# Patient Record
Sex: Female | Born: 1945 | Race: White | Hispanic: No | Marital: Married | Smoking: Never smoker
Health system: Southern US, Community
[De-identification: ages and names within clinical notes are randomized; demographics above are authoritative.]

## PROBLEM LIST (undated history)

## (undated) DIAGNOSIS — T8859XA Other complications of anesthesia, initial encounter: Secondary | ICD-10-CM

## (undated) DIAGNOSIS — M1990 Unspecified osteoarthritis, unspecified site: Secondary | ICD-10-CM

## (undated) DIAGNOSIS — T4145XA Adverse effect of unspecified anesthetic, initial encounter: Secondary | ICD-10-CM

## (undated) DIAGNOSIS — G43909 Migraine, unspecified, not intractable, without status migrainosus: Secondary | ICD-10-CM

## (undated) DIAGNOSIS — F32A Depression, unspecified: Secondary | ICD-10-CM

## (undated) DIAGNOSIS — F329 Major depressive disorder, single episode, unspecified: Secondary | ICD-10-CM

## (undated) DIAGNOSIS — I10 Essential (primary) hypertension: Secondary | ICD-10-CM

## (undated) HISTORY — DX: Depression, unspecified: F32.A

## (undated) HISTORY — DX: Unspecified osteoarthritis, unspecified site: M19.90

## (undated) HISTORY — PX: CARPAL TUNNEL RELEASE: SHX101

## (undated) HISTORY — DX: Major depressive disorder, single episode, unspecified: F32.9

## (undated) HISTORY — PX: OTHER SURGICAL HISTORY: SHX169

## (undated) HISTORY — DX: Migraine, unspecified, not intractable, without status migrainosus: G43.909

## (undated) HISTORY — DX: Essential (primary) hypertension: I10

---

## 1973-03-24 HISTORY — PX: TUBAL LIGATION: SHX77

## 1998-08-30 ENCOUNTER — Other Ambulatory Visit: Admission: RE | Admit: 1998-08-30 | Discharge: 1998-08-30 | Payer: Self-pay | Admitting: Obstetrics and Gynecology

## 2000-06-23 ENCOUNTER — Other Ambulatory Visit: Admission: RE | Admit: 2000-06-23 | Discharge: 2000-06-23 | Payer: Self-pay | Admitting: Orthopedic Surgery

## 2000-11-29 ENCOUNTER — Emergency Department (HOSPITAL_COMMUNITY): Admission: EM | Admit: 2000-11-29 | Discharge: 2000-11-29 | Payer: Self-pay | Admitting: Emergency Medicine

## 2007-01-06 ENCOUNTER — Ambulatory Visit (HOSPITAL_COMMUNITY): Admission: RE | Admit: 2007-01-06 | Discharge: 2007-01-06 | Payer: Self-pay | Admitting: *Deleted

## 2007-01-25 ENCOUNTER — Encounter: Admission: RE | Admit: 2007-01-25 | Discharge: 2007-01-25 | Payer: Self-pay | Admitting: *Deleted

## 2007-01-28 ENCOUNTER — Ambulatory Visit (HOSPITAL_COMMUNITY): Admission: RE | Admit: 2007-01-28 | Discharge: 2007-01-28 | Payer: Self-pay | Admitting: *Deleted

## 2007-08-24 ENCOUNTER — Encounter: Admission: RE | Admit: 2007-08-24 | Discharge: 2007-11-22 | Payer: Self-pay | Admitting: *Deleted

## 2007-09-06 ENCOUNTER — Ambulatory Visit (HOSPITAL_COMMUNITY): Admission: RE | Admit: 2007-09-06 | Discharge: 2007-09-07 | Payer: Self-pay | Admitting: Pediatrics

## 2007-12-15 ENCOUNTER — Encounter: Admission: RE | Admit: 2007-12-15 | Discharge: 2007-12-15 | Payer: Self-pay | Admitting: *Deleted

## 2010-06-20 LAB — HM MAMMOGRAPHY: HM Mammogram: NORMAL

## 2010-07-03 ENCOUNTER — Encounter: Payer: BLUE CROSS/BLUE SHIELD | Attending: Surgery | Admitting: *Deleted

## 2010-07-03 DIAGNOSIS — Z09 Encounter for follow-up examination after completed treatment for conditions other than malignant neoplasm: Secondary | ICD-10-CM | POA: Insufficient documentation

## 2010-07-03 DIAGNOSIS — Z713 Dietary counseling and surveillance: Secondary | ICD-10-CM | POA: Insufficient documentation

## 2010-07-03 DIAGNOSIS — Z9884 Bariatric surgery status: Secondary | ICD-10-CM | POA: Insufficient documentation

## 2010-08-06 NOTE — Op Note (Signed)
NAMEJORDYNE, Shawna Ellison                 ACCOUNT NO.:  [id number]   MEDICAL RECORD NO.:  [id number]          PATIENT TYPE:  OIB   LOCATION:  1619                         FACILITY:  [facility]   PHYSICIAN:  Alfonse Ras, MD   DATE OF BIRTH:  [birth year]   DATE OF PROCEDURE:  09/06/2007  DATE OF DISCHARGE:                               OPERATIVE REPORT   PREOPERATIVE DIAGNOSIS:  Medically refractory morbid obesity and  possible hiatal hernia.   POSTOPERATIVE DIAGNOSIS:  Medically refractory morbid obesity.  No clear  evidence of hiatal hernia.   PROCEDURE:  Laparoscopic adjustable gastric banding with APS system.   SURGEON:  Baruch Merl, M.D.   ASSISTANT:  Sandria Bales. Ezzard Standing, M.D.   ANESTHESIA:  General.   DESCRIPTION:  The patient was taken to the operating room, placed in  supine position.  After adequate general anesthesia was induced using  endotracheal tube, the abdomen was prepped and draped in normal sterile  fashion.  Using the left upper quadrant incision, peritoneal access was  obtained with the Optiview under direct vision through a 10-mm trocar.  Additional 15-mm trocar was placed under direct vision in the right  upper quadrant and 11-mm trocar was placed in the right mid abdomen.  Additional 11-mm trocar was placed in the left paramedian supraumbilical  region.  A Nathanson liver retractor was placed in the left lateral  segment and the liver was retracted anteriorly.  A 5-mm trocar was  placed in the right abdomen.  The EG junction was then visualized and  the sizing balloon was placed down into the stomach.  A 15 cc of air was  placed within the balloon that was pulled gently backwards until  resistance was felt.  There was no evidence of hiatal hernia.  The angle  of His was then gently dissected with sharp and blunt dissection.  Using  a pars gastric technique, a retrogastric tunnel was created starting at  the crossing fat at the right crus going posterior to the  stomach and  coming out at the angle of His.  APS band was then placed and snapped  and placed over the sizing balloon.  An anterior fundoplication was  accomplished with interrupted 2-0 Surgilon sutures x3.  The band  appeared in very good position and the tubing was brought out through  the right lower port.  Adequate hemostasis was ensured and Nathanson  liver retractor was removed and the port was affixed to the tubing and  fixed to the anterior abdominal fascia with interrupted 2-0 Prolene  sutures.  Skin incisions were injected with 0.5 Marcaine and closed with  staples.  Sterile dressings were applied.  The patient tolerated the  procedure well and went to PACU in good condition.      Alfonse Ras, MD  Electronically Signed     KRE/MEDQ  D:  09/06/2007  T:  09/06/2007  Job:  [id number]

## 2010-10-03 ENCOUNTER — Encounter: Payer: Self-pay | Admitting: Family Medicine

## 2010-12-19 LAB — DIFFERENTIAL
Basophils Relative: 0
Eosinophils Absolute: 0.1
Eosinophils Absolute: 0.1
Eosinophils Relative: 1
Lymphocytes Relative: 38
Lymphs Abs: 2
Monocytes Absolute: 0.5
Neutro Abs: 2.7
Neutrophils Relative %: 51

## 2010-12-19 LAB — BASIC METABOLIC PANEL
CO2: 29
Calcium: 9.4
Creatinine, Ser: 0.8
GFR calc non Af Amer: >60
Potassium: 3.6

## 2010-12-19 LAB — CBC
HCT: 40.9
HCT: 34.4 — ABNORMAL LOW
MCV: 90.9
MCV: 92.4
Platelets: 295
Platelets: 216
RBC: 4.5
RBC: 3.72 — ABNORMAL LOW
RDW: 13.1

## 2011-08-23 LAB — HM MAMMOGRAPHY: HM Mammogram: NORMAL

## 2012-03-12 ENCOUNTER — Ambulatory Visit (INDEPENDENT_AMBULATORY_CARE_PROVIDER_SITE_OTHER): Payer: BC Managed Care – PPO | Admitting: Internal Medicine

## 2012-03-12 ENCOUNTER — Other Ambulatory Visit (INDEPENDENT_AMBULATORY_CARE_PROVIDER_SITE_OTHER): Payer: BC Managed Care – PPO

## 2012-03-12 ENCOUNTER — Encounter: Payer: Self-pay | Admitting: Internal Medicine

## 2012-03-12 VITALS — BP 120/72 | HR 88 | Temp 97.6°F | Resp 16 | Ht 63.0 in | Wt 202.0 lb

## 2012-03-12 DIAGNOSIS — E669 Obesity, unspecified: Secondary | ICD-10-CM | POA: Insufficient documentation

## 2012-03-12 DIAGNOSIS — Z23 Encounter for immunization: Secondary | ICD-10-CM

## 2012-03-12 DIAGNOSIS — Z9884 Bariatric surgery status: Secondary | ICD-10-CM

## 2012-03-12 DIAGNOSIS — I10 Essential (primary) hypertension: Secondary | ICD-10-CM | POA: Insufficient documentation

## 2012-03-12 LAB — CBC WITH DIFFERENTIAL/PLATELET
Basophils Absolute: 0.1 10*3/uL (ref 0.0–0.1)
Eosinophils Absolute: 0.2 10*3/uL (ref 0.0–0.7)
Eosinophils Relative: 2.4 % (ref 0.0–5.0)
Lymphocytes Relative: 36.7 % (ref 12.0–46.0)
MCHC: 34.1 g/dL (ref 30.0–36.0)
Monocytes Absolute: 0.6 10*3/uL (ref 0.1–1.0)
Neutrophils Relative %: 51.3 % (ref 43.0–77.0)
Platelets: 317 10*3/uL (ref 150.0–400.0)
RBC: 4.67 Mil/uL (ref 3.87–5.11)
RDW: 13.5 % (ref 11.5–14.6)
WBC: 6.8 10*3/uL (ref 4.5–10.5)

## 2012-03-12 LAB — URINALYSIS, ROUTINE W REFLEX MICROSCOPIC
Hgb urine dipstick: NEGATIVE
Ketones, ur: NEGATIVE
Leukocytes, UA: NEGATIVE
Nitrite: NEGATIVE
Specific Gravity, Urine: 1.01 (ref 1.000–1.030)
Urobilinogen, UA: 0.2 (ref 0.0–1.0)

## 2012-03-12 LAB — VITAMIN B12: Vitamin B-12: 305 pg/mL (ref 211–911)

## 2012-03-12 LAB — COMPREHENSIVE METABOLIC PANEL
Alkaline Phosphatase: 80 U/L (ref 39–117)
Creatinine, Ser: 0.8 mg/dL (ref 0.4–1.2)
Glucose, Bld: 89 mg/dL (ref 70–99)
Sodium: 139 mEq/L (ref 135–145)
Total Bilirubin: 0.5 mg/dL (ref 0.3–1.2)
Total Protein: 7.5 g/dL (ref 6.0–8.3)

## 2012-03-12 LAB — LIPID PANEL
Cholesterol: 174 mg/dL (ref 0–200)
Triglycerides: 54 mg/dL (ref 0.0–149.0)

## 2012-03-12 LAB — HEMOGLOBIN A1C: Hgb A1c MFr Bld: 6 % (ref 4.6–6.5)

## 2012-03-12 LAB — IBC PANEL: Saturation Ratios: 17.8 % — ABNORMAL LOW (ref 20.0–50.0)

## 2012-03-12 LAB — FOLATE: Folate: 9.2 ng/mL (ref >5.9)

## 2012-03-12 MED ORDER — ZOSTER VACCINE LIVE 19400 UNT/0.65ML ~~LOC~~ SOLR: 0.6500 mL | Freq: Once | SUBCUTANEOUS | Status: DC

## 2012-03-12 NOTE — Assessment & Plan Note (Signed)
Labs today to screen her for secondary causes and complications as well

## 2012-03-12 NOTE — Assessment & Plan Note (Signed)
Her BP is well controlled I will check her lytes and renal function today 

## 2012-03-12 NOTE — Patient Instructions (Signed)

## 2012-03-12 NOTE — Progress Notes (Signed)
  Subjective:    Patient ID: Shawna Ellison, female    DOB: 12/16/1945, 66 y.o.   MRN: [id number]  Hypertension This is a chronic problem. The current episode started more than 1 year ago. The problem is unchanged. The problem is controlled. Pertinent negatives include no anxiety, blurred vision, chest pain, headaches, malaise/fatigue, neck pain, orthopnea, palpitations, peripheral edema, PND, shortness of breath or sweats. Past treatments include diuretics. The current treatment provides moderate improvement. There are no compliance problems.       Review of Systems  Constitutional: Negative for fever, chills, malaise/fatigue, diaphoresis, activity change, appetite change, fatigue and unexpected weight change.  HENT: Negative.  Negative for neck pain.   Eyes: Negative.  Negative for blurred vision.  Respiratory: Negative for apnea, cough, choking, chest tightness, shortness of breath, wheezing and stridor.   Cardiovascular: Negative for chest pain, palpitations, orthopnea, leg swelling and PND.  Gastrointestinal: Negative for nausea, vomiting, abdominal pain, diarrhea and constipation.  Genitourinary: Negative.   Musculoskeletal: Positive for arthralgias (knees). Negative for myalgias, back pain, joint swelling and gait problem.  Neurological: Negative.  Negative for headaches.  Hematological: Negative for adenopathy. Does not bruise/bleed easily.  Psychiatric/Behavioral: Negative.        Objective:   Physical Exam  Vitals reviewed. Constitutional: She is oriented to person, place, and time. She appears well-developed and well-nourished. No distress.  HENT:  Head: Normocephalic and atraumatic.  Mouth/Throat: Oropharynx is clear and moist. No oropharyngeal exudate.  Eyes: Conjunctivae normal are normal. Right eye exhibits no discharge. Left eye exhibits no discharge. No scleral icterus.  Neck: Normal range of motion. Neck supple. No JVD present. No tracheal deviation present. No  thyromegaly present.  Cardiovascular: Normal rate, regular rhythm, normal heart sounds and intact distal pulses.  Exam reveals no gallop and no friction rub.   No murmur heard. Pulmonary/Chest: Effort normal and breath sounds normal. No stridor. No respiratory distress. She has no wheezes. She has no rales. She exhibits no tenderness.  Abdominal: Soft. Bowel sounds are normal. She exhibits no distension and no mass. There is no tenderness. There is no rebound and no guarding.  Musculoskeletal: Normal range of motion. She exhibits no edema and no tenderness.  Lymphadenopathy:    She has no cervical adenopathy.  Neurological: She is oriented to person, place, and time.  Skin: Skin is warm and dry. No rash noted. She is not diaphoretic. No erythema. No pallor.  Psychiatric: She has a normal mood and affect. Her behavior is normal. Judgment and thought content normal.      Lab Results  Component Value Date   WBC 5.3 09/07/2007   HGB 11.7* 09/07/2007   HCT 34.4* 09/07/2007   PLT 216 09/07/2007   GLUCOSE 118* 09/01/2007   NA 139 09/01/2007   K 3.6 09/01/2007   CL 102 09/01/2007   CREATININE 0.80 09/01/2007   BUN 13 09/01/2007   CO2 29 09/01/2007      Assessment & Plan:

## 2012-03-12 NOTE — Assessment & Plan Note (Signed)
I will check her labs today to screen her for vit deficiencies and other complications

## 2012-03-17 LAB — VITAMIN D 1,25 DIHYDROXY
Vitamin D 1, 25 (OH)2 Total: 53 pg/mL (ref 18–72)
Vitamin D2 1, 25 (OH)2: <8 pg/mL
Vitamin D3 1, 25 (OH)2: 53 pg/mL

## 2012-03-21 ENCOUNTER — Encounter: Payer: Self-pay | Admitting: Internal Medicine

## 2012-03-29 ENCOUNTER — Telehealth (INDEPENDENT_AMBULATORY_CARE_PROVIDER_SITE_OTHER): Payer: Self-pay | Admitting: Surgery

## 2012-03-29 NOTE — Telephone Encounter (Signed)
03/29/12 mailed bariatric surgery recall letter to patient. Advised patient to call [phone] to schedule a bariatric follow-up appointment. (lss)

## 2012-05-17 LAB — HM COLONOSCOPY

## 2012-07-08 ENCOUNTER — Encounter: Payer: Self-pay | Admitting: Internal Medicine

## 2012-10-13 ENCOUNTER — Encounter: Payer: Self-pay | Admitting: Internal Medicine

## 2012-10-13 MED ORDER — FUROSEMIDE 20 MG PO TABS: 20.0000 mg | ORAL_TABLET | Freq: Two times a day (BID) | ORAL | Status: DC

## 2012-10-27 ENCOUNTER — Other Ambulatory Visit: Payer: Self-pay

## 2012-12-30 ENCOUNTER — Ambulatory Visit (INDEPENDENT_AMBULATORY_CARE_PROVIDER_SITE_OTHER): Payer: Medicare Other

## 2012-12-30 ENCOUNTER — Telehealth: Payer: Self-pay | Admitting: *Deleted

## 2012-12-30 DIAGNOSIS — Z23 Encounter for immunization: Secondary | ICD-10-CM

## 2012-12-30 NOTE — Telephone Encounter (Signed)
Pt called requesting an appt to receive a pneumonia vaccine. Does the pt need pneumococcal 23-valent or the prevnar 13. Please advise.

## 2012-12-31 NOTE — Telephone Encounter (Signed)
prevnat 13 first then pneumovax 2-3 months later

## 2013-01-03 NOTE — Telephone Encounter (Signed)
Discussed with pt, & scheduled nurse visit 10.16.14.

## 2013-01-06 ENCOUNTER — Ambulatory Visit (INDEPENDENT_AMBULATORY_CARE_PROVIDER_SITE_OTHER): Payer: Medicare Other

## 2013-01-06 DIAGNOSIS — Z23 Encounter for immunization: Secondary | ICD-10-CM

## 2013-03-10 ENCOUNTER — Ambulatory Visit (INDEPENDENT_AMBULATORY_CARE_PROVIDER_SITE_OTHER): Payer: Medicare Other | Admitting: Internal Medicine

## 2013-03-10 ENCOUNTER — Encounter: Payer: Self-pay | Admitting: Internal Medicine

## 2013-03-10 ENCOUNTER — Encounter: Payer: BC Managed Care – PPO | Admitting: Internal Medicine

## 2013-03-10 ENCOUNTER — Other Ambulatory Visit (INDEPENDENT_AMBULATORY_CARE_PROVIDER_SITE_OTHER): Payer: Medicare Other

## 2013-03-10 VITALS — BP 138/72 | HR 97 | Temp 97.5°F | Resp 16 | Ht 63.0 in | Wt 210.0 lb

## 2013-03-10 DIAGNOSIS — R7309 Other abnormal glucose: Secondary | ICD-10-CM

## 2013-03-10 DIAGNOSIS — E559 Vitamin D deficiency, unspecified: Secondary | ICD-10-CM

## 2013-03-10 DIAGNOSIS — M8580 Other specified disorders of bone density and structure, unspecified site: Secondary | ICD-10-CM

## 2013-03-10 DIAGNOSIS — Z9884 Bariatric surgery status: Secondary | ICD-10-CM

## 2013-03-10 DIAGNOSIS — I10 Essential (primary) hypertension: Secondary | ICD-10-CM

## 2013-03-10 DIAGNOSIS — M899 Disorder of bone, unspecified: Secondary | ICD-10-CM

## 2013-03-10 DIAGNOSIS — Z1231 Encounter for screening mammogram for malignant neoplasm of breast: Secondary | ICD-10-CM

## 2013-03-10 DIAGNOSIS — N393 Stress incontinence (female) (male): Secondary | ICD-10-CM

## 2013-03-10 DIAGNOSIS — E669 Obesity, unspecified: Secondary | ICD-10-CM

## 2013-03-10 DIAGNOSIS — Z0000 Encounter for general adult medical examination without abnormal findings: Secondary | ICD-10-CM

## 2013-03-10 DIAGNOSIS — R739 Hyperglycemia, unspecified: Secondary | ICD-10-CM | POA: Insufficient documentation

## 2013-03-10 LAB — URINALYSIS, ROUTINE W REFLEX MICROSCOPIC
Bilirubin Urine: NEGATIVE
Leukocytes, UA: NEGATIVE
Nitrite: NEGATIVE
RBC / HPF: NONE SEEN (ref 0–?)
Specific Gravity, Urine: 1.02 (ref 1.000–1.030)
Urobilinogen, UA: 0.2 (ref 0.0–1.0)
pH: 5.5 (ref 5.0–8.0)

## 2013-03-10 LAB — CBC WITH DIFFERENTIAL/PLATELET
Basophils Absolute: 0.1 K/uL (ref 0.0–0.1)
Basophils Relative: 1.1 % (ref 0.0–3.0)
Eosinophils Absolute: 0.1 K/uL (ref 0.0–0.7)
Eosinophils Relative: 2.4 % (ref 0.0–5.0)
HCT: 42.7 % (ref 36.0–46.0)
Hemoglobin: 14.6 g/dL (ref 12.0–15.0)
Lymphocytes Relative: 42.4 % (ref 12.0–46.0)
Lymphs Abs: 2.5 K/uL (ref 0.7–4.0)
MCHC: 34.2 g/dL (ref 30.0–36.0)
MCV: 91.6 fl (ref 78.0–100.0)
Monocytes Absolute: 0.5 K/uL (ref 0.1–1.0)
Monocytes Relative: 7.7 % (ref 3.0–12.0)
Neutro Abs: 2.8 K/uL (ref 1.4–7.7)
Neutrophils Relative %: 46.4 % (ref 43.0–77.0)
Platelets: 319 K/uL (ref 150.0–400.0)
RBC: 4.67 Mil/uL (ref 3.87–5.11)
RDW: 13.1 % (ref 11.5–14.6)
WBC: 6 K/uL (ref 4.5–10.5)

## 2013-03-10 LAB — LIPID PANEL
Cholesterol: 185 mg/dL (ref 0–200)
HDL: 60.3 mg/dL (ref >39.00)
Total CHOL/HDL Ratio: 3
VLDL: 15.2 mg/dL (ref 0.0–40.0)

## 2013-03-10 LAB — COMPREHENSIVE METABOLIC PANEL
AST: 21 U/L (ref 0–37)
Albumin: 4.4 g/dL (ref 3.5–5.2)
Alkaline Phosphatase: 81 U/L (ref 39–117)
BUN: 9 mg/dL (ref 6–23)
Calcium: 9.3 mg/dL (ref 8.4–10.5)
Chloride: 103 mEq/L (ref 96–112)
GFR: 86.24 mL/min (ref >60.00)
Glucose, Bld: 88 mg/dL (ref 70–99)
Potassium: 3.9 mEq/L (ref 3.5–5.1)
Sodium: 140 mEq/L (ref 135–145)
Total Bilirubin: 0.8 mg/dL (ref 0.3–1.2)
Total Protein: 7.8 g/dL (ref 6.0–8.3)

## 2013-03-10 LAB — HEMOGLOBIN A1C: Hgb A1c MFr Bld: 5.7 % (ref 4.6–6.5)

## 2013-03-10 LAB — IBC PANEL
Iron: 82 ug/dL (ref 42–145)
Saturation Ratios: 21.5 % (ref 20.0–50.0)
Transferrin: 272.2 mg/dL (ref 212.0–360.0)

## 2013-03-10 LAB — VITAMIN B12: Vitamin B-12: 321 pg/mL (ref 211–911)

## 2013-03-10 LAB — FERRITIN: Ferritin: 47 ng/mL (ref 10.0–291.0)

## 2013-03-10 LAB — TSH: TSH: 1.08 u[IU]/mL (ref 0.35–5.50)

## 2013-03-10 NOTE — Progress Notes (Signed)
   Subjective:    Patient ID: Shawna Ellison, female    DOB: 05/27/1945, 67 y.o.   MRN: [id number]  HPI  She returns for a physical but she also complains of urinary incontinence with physical stress.  Review of Systems  Constitutional: Negative.  Negative for fever, chills, diaphoresis, appetite change and fatigue.  HENT: Negative.   Eyes: Negative.   Respiratory: Negative.  Negative for apnea, cough, choking, chest tightness, shortness of breath, wheezing and stridor.   Cardiovascular: Negative.  Negative for chest pain, palpitations and leg swelling.  Gastrointestinal: Negative.  Negative for abdominal pain.  Endocrine: Negative.   Genitourinary: Positive for difficulty urinating. Negative for dysuria, urgency, frequency, hematuria, flank pain, decreased urine volume, vaginal bleeding, vaginal discharge, enuresis, genital sores, vaginal pain, menstrual problem, pelvic pain and dyspareunia.  Musculoskeletal: Negative.   Skin: Negative.   Allergic/Immunologic: Negative.   Neurological: Negative.   Hematological: Negative.  Negative for adenopathy. Does not bruise/bleed easily.  Psychiatric/Behavioral: Negative.        Objective:   Physical Exam  Vitals reviewed. Constitutional: She is oriented to person, place, and time. She appears well-developed and well-nourished. No distress.  HENT:  Head: Normocephalic and atraumatic.  Mouth/Throat: Oropharynx is clear and moist. No oropharyngeal exudate.  Eyes: Conjunctivae are normal. Right eye exhibits no discharge. Left eye exhibits no discharge. No scleral icterus.  Neck: Normal range of motion. Neck supple. No JVD present. No tracheal deviation present. No thyromegaly present.  Cardiovascular: Normal rate, regular rhythm, normal heart sounds and intact distal pulses.  Exam reveals no gallop and no friction rub.   No murmur heard. Pulmonary/Chest: Effort normal and breath sounds normal. No accessory muscle usage or stridor. Not  tachypneic. No respiratory distress. She has no wheezes. She has no rales. She exhibits no tenderness. Right breast exhibits no inverted nipple, no mass, no nipple discharge, no skin change and no tenderness. Left breast exhibits no inverted nipple, no mass, no nipple discharge, no skin change and no tenderness. Breasts are symmetrical.  Abdominal: Soft. Bowel sounds are normal. She exhibits no distension and no mass. There is no tenderness. There is no rebound and no guarding.  Musculoskeletal: Normal range of motion. She exhibits no edema and no tenderness.  Lymphadenopathy:    She has no cervical adenopathy.  Neurological: She is oriented to person, place, and time.  Skin: Skin is warm and dry. No rash noted. She is not diaphoretic. No erythema. No pallor.  Psychiatric: She has a normal mood and affect. Her behavior is normal. Judgment and thought content normal.     Lab Results  Component Value Date   WBC 6.8 03/12/2012   HGB 14.6 03/12/2012   HCT 42.8 03/12/2012   PLT 317.0 03/12/2012   GLUCOSE 89 03/12/2012   CHOL 174 03/12/2012   TRIG 54.0 03/12/2012   HDL 78.60 03/12/2012   LDLCALC 85 03/12/2012   ALT 22 03/12/2012   AST 18 03/12/2012   NA 139 03/12/2012   K 4.5 03/12/2012   CL 102 03/12/2012   CREATININE 0.8 03/12/2012   BUN 15 03/12/2012   CO2 33* 03/12/2012   TSH 1.51 03/12/2012   HGBA1C 6.0 03/12/2012       Assessment & Plan:

## 2013-03-10 NOTE — Progress Notes (Signed)
Pre visit review using our clinic review tool, if applicable. No additional management support is needed unless otherwise documented below in the visit note. 

## 2013-03-10 NOTE — Patient Instructions (Signed)
Preventive Care for Adults, Female A healthy lifestyle and preventive care can promote health and wellness. Preventive health guidelines for women include the following key practices.  A routine yearly physical is a good way to check with your caregiver about your health and preventive screening. It is a chance to share any concerns and updates on your health, and to receive a thorough exam.  Visit your dentist for a routine exam and preventive care every 6 months. Brush your teeth twice a day and floss once a day. Good oral hygiene prevents tooth decay and gum disease.  The frequency of eye exams is based on your age, health, family medical history, use of contact lenses, and other factors. Follow your caregiver's recommendations for frequency of eye exams.  Eat a healthy diet. Foods like vegetables, fruits, whole grains, low-fat dairy products, and lean protein foods contain the nutrients you need without too many calories. Decrease your intake of foods high in solid fats, added sugars, and salt. Eat the right amount of calories for you.Get information about a proper diet from your caregiver, if necessary.  Regular physical exercise is one of the most important things you can do for your health. Most adults should get at least 150 minutes of moderate-intensity exercise (any activity that increases your heart rate and causes you to sweat) each week. In addition, most adults need muscle-strengthening exercises on 2 or more days a week.  Maintain a healthy weight. The body mass index (BMI) is a screening tool to identify possible weight problems. It provides an estimate of body fat based on height and weight. Your caregiver can help determine your BMI, and can help you achieve or maintain a healthy weight.For adults 20 years and older:  A BMI below 18.5 is considered underweight.  A BMI of 18.5 to 24.9 is normal.  A BMI of 25 to 29.9 is considered overweight.  A BMI of 30 and above is  considered obese.  Maintain normal blood lipids and cholesterol levels by exercising and minimizing your intake of saturated fat. Eat a balanced diet with plenty of fruit and vegetables. Blood tests for lipids and cholesterol should begin at age 20 and be repeated every 5 years. If your lipid or cholesterol levels are high, you are over 50, or you are at high risk for heart disease, you may need your cholesterol levels checked more frequently.Ongoing high lipid and cholesterol levels should be treated with medicines if diet and exercise are not effective.  If you smoke, find out from your caregiver how to quit. If you do not use tobacco, do not start.  Lung cancer screening is recommended for adults aged 55 80 years who are at high risk for developing lung cancer because of a history of smoking. Yearly low-dose computed tomography (CT) is recommended for people who have at least a 30-pack-year history of smoking and are a current smoker or have quit within the past 15 years. A pack year of smoking is smoking an average of 1 pack of cigarettes a day for 1 year (for example: 1 pack a day for 30 years or 2 packs a day for 15 years). Yearly screening should continue until the smoker has stopped smoking for at least 15 years. Yearly screening should also be stopped for people who develop a health problem that would prevent them from having lung cancer treatment.  If you are pregnant, do not drink alcohol. If you are breastfeeding, be very cautious about drinking alcohol. If you are   not pregnant and choose to drink alcohol, do not exceed 1 drink per day. One drink is considered to be 12 ounces (355 mL) of beer, 5 ounces (148 mL) of wine, or 1.5 ounces (44 mL) of liquor.  Avoid use of street drugs. Do not share needles with anyone. Ask for help if you need support or instructions about stopping the use of drugs.  High blood pressure causes heart disease and increases the risk of stroke. Your blood pressure  should be checked at least every 1 to 2 years. Ongoing high blood pressure should be treated with medicines if weight loss and exercise are not effective.  If you are 55 to 65 years old, ask your caregiver if you should take aspirin to prevent strokes.  Diabetes screening involves taking a blood sample to check your fasting blood sugar level. This should be done once every 3 years, after age 45, if you are within normal weight and without risk factors for diabetes. Testing should be considered at a younger age or be carried out more frequently if you are overweight and have at least 1 risk factor for diabetes.  Breast cancer screening is essential preventive care for women. You should practice "breast self-awareness." This means understanding the normal appearance and feel of your breasts and may include breast self-examination. Any changes detected, no matter how small, should be reported to a caregiver. Women in their 20s and 30s should have a clinical breast exam (CBE) by a caregiver as part of a regular health exam every 1 to 3 years. After age 40, women should have a CBE every year. Starting at age 40, women should consider having a mammography (breast X-ray test) every year. Women who have a family history of breast cancer should talk to their caregiver about genetic screening. Women at a high risk of breast cancer should talk to their caregivers about having magnetic resonance imaging (MRI) and a mammography every year.  Breast cancer gene (BRCA)-related cancer risk assessment is recommended for women who have family members with BRCA-related cancers. BRCA-related cancers include breast, ovarian, tubal, and peritoneal cancers. Having family members with these cancers may be associated with an increased risk for harmful changes (mutations) in the breast cancer genes BRCA1 and BRCA2. Results of the assessment will determine the need for genetic counseling and BRCA1 and BRCA2 testing.  The Pap test is  a screening test for cervical cancer. A Pap test can show cell changes on the cervix that might become cervical cancer if left untreated. A Pap test is a procedure in which cells are obtained and examined from the lower end of the uterus (cervix).  Women should have a Pap test starting at age 21.  Between ages 21 and 29, Pap tests should be repeated every 2 years.  Beginning at age 30, you should have a Pap test every 3 years as long as the past 3 Pap tests have been normal.  Some women have medical problems that increase the chance of getting cervical cancer. Talk to your caregiver about these problems. It is especially important to talk to your caregiver if a new problem develops soon after your last Pap test. In these cases, your caregiver may recommend more frequent screening and Pap tests.  The above recommendations are the same for women who have or have not gotten the vaccine for human papillomavirus (HPV).  If you had a hysterectomy for a problem that was not cancer or a condition that could lead to cancer, then   you no longer need Pap tests. Even if you no longer need a Pap test, a regular exam is a good idea to make sure no other problems are starting.  If you are between ages 65 and 70, and you have had normal Pap tests going back 10 years, you no longer need Pap tests. Even if you no longer need a Pap test, a regular exam is a good idea to make sure no other problems are starting.  If you have had past treatment for cervical cancer or a condition that could lead to cancer, you need Pap tests and screening for cancer for at least 20 years after your treatment.  If Pap tests have been discontinued, risk factors (such as a new sexual partner) need to be reassessed to determine if screening should be resumed.  The HPV test is an additional test that may be used for cervical cancer screening. The HPV test looks for the virus that can cause the cell changes on the cervix. The cells collected  during the Pap test can be tested for HPV. The HPV test could be used to screen women aged 30 years and older, and should be used in women of any age who have unclear Pap test results. After the age of 30, women should have HPV testing at the same frequency as a Pap test.  Colorectal cancer can be detected and often prevented. Most routine colorectal cancer screening begins at the age of 50 and continues through age 75. However, your caregiver may recommend screening at an earlier age if you have risk factors for colon cancer. On a yearly basis, your caregiver may provide home test kits to check for hidden blood in the stool. Use of a small camera at the end of a tube, to directly examine the colon (sigmoidoscopy or colonoscopy), can detect the earliest forms of colorectal cancer. Talk to your caregiver about this at age 50, when routine screening begins. Direct examination of the colon should be repeated every 5 to 10 years through age 75, unless early forms of pre-cancerous polyps or small growths are found.  Hepatitis C blood testing is recommended for all people born from 1945 through 1965 and any individual with known risks for hepatitis C.  Practice safe sex. Use condoms and avoid high-risk sexual practices to reduce the spread of sexually transmitted infections (STIs). STIs include gonorrhea, chlamydia, syphilis, trichomonas, herpes, HPV, and human immunodeficiency virus (HIV). Herpes, HIV, and HPV are viral illnesses that have no cure. They can result in disability, cancer, and death. Sexually active women aged 25 and younger should be checked for chlamydia. Older women with new or multiple partners should also be tested for chlamydia. Testing for other STIs is recommended if you are sexually active and at increased risk.  Osteoporosis is a disease in which the bones lose minerals and strength with aging. This can result in serious bone fractures. The risk of osteoporosis can be identified using a  bone density scan. Women ages 65 and over and women at risk for fractures or osteoporosis should discuss screening with their caregivers. Ask your caregiver whether you should take a calcium supplement or vitamin D to reduce the rate of osteoporosis.  Menopause can be associated with physical symptoms and risks. Hormone replacement therapy is available to decrease symptoms and risks. You should talk to your caregiver about whether hormone replacement therapy is right for you.  Use sunscreen. Apply sunscreen liberally and repeatedly throughout the day. You should seek shade   when your shadow is shorter than you. Protect yourself by wearing long sleeves, pants, a wide-brimmed hat, and sunglasses year round, whenever you are outdoors.  Once a month, do a whole body skin exam, using a mirror to look at the skin on your back. Notify your caregiver of new moles, moles that have irregular borders, moles that are larger than a pencil eraser, or moles that have changed in shape or color.  Stay current with required immunizations.  Influenza vaccine. All adults should be immunized every year.  Tetanus, diphtheria, and acellular pertussis (Td, Tdap) vaccine. Pregnant women should receive 1 dose of Tdap vaccine during each pregnancy. The dose should be obtained regardless of the length of time since the last dose. Immunization is preferred during the 27th to 36th week of gestation. An adult who has not previously received Tdap or who does not know her vaccine status should receive 1 dose of Tdap. This initial dose should be followed by tetanus and diphtheria toxoids (Td) booster doses every 10 years. Adults with an unknown or incomplete history of completing a 3-dose immunization series with Td-containing vaccines should begin or complete a primary immunization series including a Tdap dose. Adults should receive a Td booster every 10 years.  Varicella vaccine. An adult without evidence of immunity to varicella  should receive 2 doses or a second dose if she has previously received 1 dose. Pregnant females who do not have evidence of immunity should receive the first dose after pregnancy. This first dose should be obtained before leaving the health care facility. The second dose should be obtained 4 8 weeks after the first dose.  Human papillomavirus (HPV) vaccine. Females aged 13 26 years who have not received the vaccine previously should obtain the 3-dose series. The vaccine is not recommended for use in pregnant females. However, pregnancy testing is not needed before receiving a dose. If a female is found to be pregnant after receiving a dose, no treatment is needed. In that case, the remaining doses should be delayed until after the pregnancy. Immunization is recommended for any person with an immunocompromised condition through the age of 26 years if she did not get any or all doses earlier. During the 3-dose series, the second dose should be obtained 4 8 weeks after the first dose. The third dose should be obtained 24 weeks after the first dose and 16 weeks after the second dose.  Zoster vaccine. One dose is recommended for adults aged 60 years or older unless certain conditions are present.  Measles, mumps, and rubella (MMR) vaccine. Adults born before 1957 generally are considered immune to measles and mumps. Adults born in 1957 or later should have 1 or more doses of MMR vaccine unless there is a contraindication to the vaccine or there is laboratory evidence of immunity to each of the three diseases. A routine second dose of MMR vaccine should be obtained at least 28 days after the first dose for students attending postsecondary schools, health care workers, or international travelers. People who received inactivated measles vaccine or an unknown type of measles vaccine during 1963 1967 should receive 2 doses of MMR vaccine. People who received inactivated mumps vaccine or an unknown type of mumps vaccine  before 1979 and are at high risk for mumps infection should consider immunization with 2 doses of MMR vaccine. For females of childbearing age, rubella immunity should be determined. If there is no evidence of immunity, females who are not pregnant should be vaccinated. If there   is no evidence of immunity, females who are pregnant should delay immunization until after pregnancy. Unvaccinated health care workers born before 1957 who lack laboratory evidence of measles, mumps, or rubella immunity or laboratory confirmation of disease should consider measles and mumps immunization with 2 doses of MMR vaccine or rubella immunization with 1 dose of MMR vaccine.  Pneumococcal 13-valent conjugate (PCV13) vaccine. When indicated, a person who is uncertain of her immunization history and has no record of immunization should receive the PCV13 vaccine. An adult aged 19 years or older who has certain medical conditions and has not been previously immunized should receive 1 dose of PCV13 vaccine. This PCV13 should be followed with a dose of pneumococcal polysaccharide (PPSV23) vaccine. The PPSV23 vaccine dose should be obtained at least 8 weeks after the dose of PCV13 vaccine. An adult aged 19 years or older who has certain medical conditions and previously received 1 or more doses of PPSV23 vaccine should receive 1 dose of PCV13. The PCV13 vaccine dose should be obtained 1 or more years after the last PPSV23 vaccine dose.  Pneumococcal polysaccharide (PPSV23) vaccine. When PCV13 is also indicated, PCV13 should be obtained first. All adults aged 65 years and older should be immunized. An adult younger than age 65 years who has certain medical conditions should be immunized. Any person who resides in a nursing home or long-term care facility should be immunized. An adult smoker should be immunized. People with an immunocompromised condition and certain other conditions should receive both PCV13 and PPSV23 vaccines. People  with human immunodeficiency virus (HIV) infection should be immunized as soon as possible after diagnosis. Immunization during chemotherapy or radiation therapy should be avoided. Routine use of PPSV23 vaccine is not recommended for American Indians, [place] Natives, or people younger than 65 years unless there are medical conditions that require PPSV23 vaccine. When indicated, people who have unknown immunization and have no record of immunization should receive PPSV23 vaccine. One-time revaccination 5 years after the first dose of PPSV23 is recommended for people aged 19 64 years who have chronic kidney failure, nephrotic syndrome, asplenia, or immunocompromised conditions. People who received 1 2 doses of PPSV23 before age 65 years should receive another dose of PPSV23 vaccine at age 65 years or later if at least 5 years have passed since the previous dose. Doses of PPSV23 are not needed for people immunized with PPSV23 at or after age 65 years.  Meningococcal vaccine. Adults with asplenia or persistent complement component deficiencies should receive 2 doses of quadrivalent meningococcal conjugate (MenACWY-D) vaccine. The doses should be obtained at least 2 months apart. Microbiologists working with certain meningococcal bacteria, military recruits, people at risk during an outbreak, and people who travel to or live in countries with a high rate of meningitis should be immunized. A first-year college student up through age 21 years who is living in a residence hall should receive a dose if she did not receive a dose on or after her 16th birthday. Adults who have certain high-risk conditions should receive one or more doses of vaccine.  Hepatitis A vaccine. Adults who wish to be protected from this disease, have certain high-risk conditions, work with hepatitis A-infected animals, work in hepatitis A research labs, or travel to or work in countries with a high rate of hepatitis A should be immunized. Adults  who were previously unvaccinated and who anticipate close contact with an international adoptee during the first 60 days after arrival in the [place] from a country   with a high rate of hepatitis A should be immunized.  Hepatitis B vaccine. Adults who wish to be protected from this disease, have certain high-risk conditions, may be exposed to blood or other infectious body fluids, are household contacts or sex partners of hepatitis B positive people, are clients or workers in certain care facilities, or travel to or work in countries with a high rate of hepatitis B should be immunized.  Haemophilus influenzae type b (Hib) vaccine. A previously unvaccinated person with asplenia or sickle cell disease or having a scheduled splenectomy should receive 1 dose of Hib vaccine. Regardless of previous immunization, a recipient of a hematopoietic stem cell transplant should receive a 3-dose series 6 12 months after her successful transplant. Hib vaccine is not recommended for adults with HIV infection. Preventive Services / Frequency Ages 19 to 39  Blood pressure check.** / Every 1 to 2 years.  Lipid and cholesterol check.** / Every 5 years beginning at age 20.  Clinical breast exam.** / Every 3 years for women in their 20s and 30s.  BRCA-related cancer risk assessment.** / For women who have family members with a BRCA-related cancer (breast, ovarian, tubal, or peritoneal cancers).  Pap test.** / Every 2 years from ages 21 through 29. Every 3 years starting at age 30 through age 65 or 70 with a history of 3 consecutive normal Pap tests.  HPV screening.** / Every 3 years from ages 30 through ages 65 to 70 with a history of 3 consecutive normal Pap tests.  Hepatitis C blood test.** / For any individual with known risks for hepatitis C.  Skin self-exam. / Monthly.  Influenza vaccine. / Every year.  Tetanus, diphtheria, and acellular pertussis (Tdap, Td) vaccine.** / Consult your caregiver. Pregnant  women should receive 1 dose of Tdap vaccine during each pregnancy. 1 dose of Td every 10 years.  Varicella vaccine.** / Consult your caregiver. Pregnant females who do not have evidence of immunity should receive the first dose after pregnancy.  HPV vaccine. / 3 doses over 6 months, if 26 and younger. The vaccine is not recommended for use in pregnant females. However, pregnancy testing is not needed before receiving a dose.  Measles, mumps, rubella (MMR) vaccine.** / You need at least 1 dose of MMR if you were born in 1957 or later. You may also need a 2nd dose. For females of childbearing age, rubella immunity should be determined. If there is no evidence of immunity, females who are not pregnant should be vaccinated. If there is no evidence of immunity, females who are pregnant should delay immunization until after pregnancy.  Pneumococcal 13-valent conjugate (PCV13) vaccine.** / Consult your caregiver.  Pneumococcal polysaccharide (PPSV23) vaccine.** / 1 to 2 doses if you smoke cigarettes or if you have certain conditions.  Meningococcal vaccine.** / 1 dose if you are age 19 to 21 years and a first-year college student living in a residence hall, or have one of several medical conditions, you need to get vaccinated against meningococcal disease. You may also need additional booster doses.  Hepatitis A vaccine.** / Consult your caregiver.  Hepatitis B vaccine.** / Consult your caregiver.  Haemophilus influenzae type b (Hib) vaccine.** / Consult your caregiver. Ages 40 to 64  Blood pressure check.** / Every 1 to 2 years.  Lipid and cholesterol check.** / Every 5 years beginning at age 20.  Lung cancer screening. / Every year if you are aged 55 80 years and have a 30-pack-year history of smoking and   currently smoke or have quit within the past 15 years. Yearly screening is stopped once you have quit smoking for at least 15 years or develop a health problem that would prevent you from having  lung cancer treatment.  Clinical breast exam.** / Every year after age 40.  BRCA-related cancer risk assessment.** / For women who have family members with a BRCA-related cancer (breast, ovarian, tubal, or peritoneal cancers).  Mammogram.** / Every year beginning at age 40 and continuing for as long as you are in good health. Consult with your caregiver.  Pap test.** / Every 3 years starting at age 30 through age 65 or 70 with a history of 3 consecutive normal Pap tests.  HPV screening.** / Every 3 years from ages 30 through ages 65 to 70 with a history of 3 consecutive normal Pap tests.  Fecal occult blood test (FOBT) of stool. / Every year beginning at age 50 and continuing until age 75. You may not need to do this test if you get a colonoscopy every 10 years.  Flexible sigmoidoscopy or colonoscopy.** / Every 5 years for a flexible sigmoidoscopy or every 10 years for a colonoscopy beginning at age 50 and continuing until age 75.  Hepatitis C blood test.** / For all people born from 1945 through 1965 and any individual with known risks for hepatitis C.  Skin self-exam. / Monthly.  Influenza vaccine. / Every year.  Tetanus, diphtheria, and acellular pertussis (Tdap/Td) vaccine.** / Consult your caregiver. Pregnant women should receive 1 dose of Tdap vaccine during each pregnancy. 1 dose of Td every 10 years.  Varicella vaccine.** / Consult your caregiver. Pregnant females who do not have evidence of immunity should receive the first dose after pregnancy.  Zoster vaccine.** / 1 dose for adults aged 60 years or older.  Measles, mumps, rubella (MMR) vaccine.** / You need at least 1 dose of MMR if you were born in 1957 or later. You may also need a 2nd dose. For females of childbearing age, rubella immunity should be determined. If there is no evidence of immunity, females who are not pregnant should be vaccinated. If there is no evidence of immunity, females who are pregnant should delay  immunization until after pregnancy.  Pneumococcal 13-valent conjugate (PCV13) vaccine.** / Consult your caregiver.  Pneumococcal polysaccharide (PPSV23) vaccine.** / 1 to 2 doses if you smoke cigarettes or if you have certain conditions.  Meningococcal vaccine.** / Consult your caregiver.  Hepatitis A vaccine.** / Consult your caregiver.  Hepatitis B vaccine.** / Consult your caregiver.  Haemophilus influenzae type b (Hib) vaccine.** / Consult your caregiver. Ages 65 and over  Blood pressure check.** / Every 1 to 2 years.  Lipid and cholesterol check.** / Every 5 years beginning at age 20.  Lung cancer screening. / Every year if you are aged 55 80 years and have a 30-pack-year history of smoking and currently smoke or have quit within the past 15 years. Yearly screening is stopped once you have quit smoking for at least 15 years or develop a health problem that would prevent you from having lung cancer treatment.  Clinical breast exam.** / Every year after age 40.  BRCA-related cancer risk assessment.** / For women who have family members with a BRCA-related cancer (breast, ovarian, tubal, or peritoneal cancers).  Mammogram.** / Every year beginning at age 40 and continuing for as long as you are in good health. Consult with your caregiver.  Pap test.** / Every 3 years starting at age   30 through age 65 or 70 with a 3 consecutive normal Pap tests. Testing can be stopped between 65 and 70 with 3 consecutive normal Pap tests and no abnormal Pap or HPV tests in the past 10 years.  HPV screening.** / Every 3 years from ages 30 through ages 65 or 70 with a history of 3 consecutive normal Pap tests. Testing can be stopped between 65 and 70 with 3 consecutive normal Pap tests and no abnormal Pap or HPV tests in the past 10 years.  Fecal occult blood test (FOBT) of stool. / Every year beginning at age 50 and continuing until age 75. You may not need to do this test if you get a colonoscopy  every 10 years.  Flexible sigmoidoscopy or colonoscopy.** / Every 5 years for a flexible sigmoidoscopy or every 10 years for a colonoscopy beginning at age 50 and continuing until age 75.  Hepatitis C blood test.** / For all people born from 1945 through 1965 and any individual with known risks for hepatitis C.  Osteoporosis screening.** / A one-time screening for women ages 65 and over and women at risk for fractures or osteoporosis.  Skin self-exam. / Monthly.  Influenza vaccine. / Every year.  Tetanus, diphtheria, and acellular pertussis (Tdap/Td) vaccine.** / 1 dose of Td every 10 years.  Varicella vaccine.** / Consult your caregiver.  Zoster vaccine.** / 1 dose for adults aged 60 years or older.  Pneumococcal 13-valent conjugate (PCV13) vaccine.** / Consult your caregiver.  Pneumococcal polysaccharide (PPSV23) vaccine.** / 1 dose for all adults aged 65 years and older.  Meningococcal vaccine.** / Consult your caregiver.  Hepatitis A vaccine.** / Consult your caregiver.  Hepatitis B vaccine.** / Consult your caregiver.  Haemophilus influenzae type b (Hib) vaccine.** / Consult your caregiver. ** Family history and personal history of risk and conditions may change your caregiver's recommendations. Document Released: 05/06/2001 Document Revised: 07/05/2012 Document Reviewed: 08/05/2010 [employer] Patient Information 2014 [employer].  

## 2013-03-11 ENCOUNTER — Encounter: Payer: Self-pay | Admitting: Internal Medicine

## 2013-03-11 DIAGNOSIS — E559 Vitamin D deficiency, unspecified: Secondary | ICD-10-CM | POA: Insufficient documentation

## 2013-03-11 MED ORDER — CHOLECALCIFEROL 50 MCG (2000 UT) PO TABS: 1.0000 | ORAL_TABLET | Freq: Every day | ORAL | Status: DC

## 2013-03-13 ENCOUNTER — Encounter: Payer: Self-pay | Admitting: Internal Medicine

## 2013-03-13 NOTE — Assessment & Plan Note (Signed)
I will check her A1C to see if she has developed DM2 

## 2013-03-13 NOTE — Assessment & Plan Note (Signed)
Urology referral

## 2013-03-13 NOTE — Assessment & Plan Note (Signed)
I will check her labs to look for vitamin deficiencies and complications

## 2013-03-13 NOTE — Assessment & Plan Note (Signed)

## 2013-03-13 NOTE — Assessment & Plan Note (Signed)
Start Vit D replacement therapy 

## 2013-03-13 NOTE — Assessment & Plan Note (Signed)
Her BP is well controlled 

## 2013-03-13 NOTE — Assessment & Plan Note (Signed)
I will check her labs to look for vitamin deficiencies and complications 

## 2013-03-13 NOTE — Assessment & Plan Note (Signed)
I have asked her to have a DEXA scan done She will start Vit D replacement therapy

## 2013-03-14 ENCOUNTER — Encounter: Payer: Self-pay | Admitting: Internal Medicine

## 2013-03-14 ENCOUNTER — Other Ambulatory Visit: Payer: Self-pay | Admitting: Internal Medicine

## 2013-03-14 DIAGNOSIS — Z9884 Bariatric surgery status: Secondary | ICD-10-CM

## 2013-03-14 LAB — VITAMIN B1: Vitamin B1 (Thiamine): 9 nmol/L (ref 8–30)

## 2013-03-14 MED ORDER — VITAMIN B-1 100 MG PO TABS: 100.0000 mg | ORAL_TABLET | Freq: Every day | ORAL | Status: DC

## 2013-03-28 ENCOUNTER — Other Ambulatory Visit: Payer: Self-pay | Admitting: Internal Medicine

## 2013-03-28 ENCOUNTER — Encounter: Payer: Self-pay | Admitting: Internal Medicine

## 2013-03-28 DIAGNOSIS — N393 Stress incontinence (female) (male): Secondary | ICD-10-CM

## 2013-03-29 ENCOUNTER — Encounter: Payer: Self-pay | Admitting: Internal Medicine

## 2013-03-30 ENCOUNTER — Telehealth: Payer: Self-pay | Admitting: Internal Medicine

## 2013-03-30 ENCOUNTER — Other Ambulatory Visit: Payer: Self-pay | Admitting: Internal Medicine

## 2013-03-30 NOTE — Telephone Encounter (Signed)
03/30/2013  Pt is needing a referral to Dr. Theda Sers, [facility].  Thanks.

## 2013-03-30 NOTE — Telephone Encounter (Signed)
For what?

## 2013-03-31 NOTE — Telephone Encounter (Signed)
Message sent via mychart for pt to advise.

## 2013-04-01 ENCOUNTER — Encounter: Payer: Self-pay | Admitting: Internal Medicine

## 2013-04-01 ENCOUNTER — Other Ambulatory Visit: Payer: Self-pay | Admitting: Internal Medicine

## 2013-04-01 DIAGNOSIS — M7120 Synovial cyst of popliteal space [Baker], unspecified knee: Secondary | ICD-10-CM | POA: Insufficient documentation

## 2013-04-01 DIAGNOSIS — N393 Stress incontinence (female) (male): Secondary | ICD-10-CM

## 2013-09-05 ENCOUNTER — Encounter: Payer: Self-pay | Admitting: Internal Medicine

## 2013-09-10 ENCOUNTER — Encounter: Payer: Self-pay | Admitting: Internal Medicine

## 2013-10-28 ENCOUNTER — Ambulatory Visit (HOSPITAL_COMMUNITY)
Admission: RE | Admit: 2013-10-28 | Discharge: 2013-10-28 | Disposition: A | Discharge status: Home / Self Care | Payer: Medicare HMO | Source: Ambulatory Visit | Attending: Specialist | Admitting: Specialist

## 2013-10-28 ENCOUNTER — Other Ambulatory Visit (HOSPITAL_COMMUNITY): Payer: Self-pay | Admitting: Specialist

## 2013-10-28 DIAGNOSIS — M79661 Pain in right lower leg: Secondary | ICD-10-CM

## 2013-10-28 DIAGNOSIS — M7989 Other specified soft tissue disorders: Secondary | ICD-10-CM | POA: Diagnosis not present

## 2013-10-28 DIAGNOSIS — M79609 Pain in unspecified limb: Secondary | ICD-10-CM | POA: Diagnosis present

## 2013-10-28 NOTE — Progress Notes (Signed)
VASCULAR LAB PRELIMINARY  PRELIMINARY  PRELIMINARY  PRELIMINARY  Right lower extremity venous duplex completed.    Preliminary report:  Right:  No evidence of DVT, superficial thrombosis, or Baker's cyst.  Brayson Livesey, RVT 10/28/2013, 2:44 PM

## 2014-02-20 LAB — HM MAMMOGRAPHY

## 2014-02-23 ENCOUNTER — Encounter: Payer: Self-pay | Admitting: Internal Medicine

## 2014-03-09 ENCOUNTER — Encounter: Payer: Self-pay | Admitting: Internal Medicine

## 2014-03-09 ENCOUNTER — Other Ambulatory Visit (INDEPENDENT_AMBULATORY_CARE_PROVIDER_SITE_OTHER): Payer: Commercial Managed Care - HMO

## 2014-03-09 ENCOUNTER — Ambulatory Visit (INDEPENDENT_AMBULATORY_CARE_PROVIDER_SITE_OTHER): Payer: Commercial Managed Care - HMO | Admitting: Internal Medicine

## 2014-03-09 VITALS — BP 150/92 | HR 85 | Temp 97.4°F | Resp 16 | Ht 63.0 in | Wt 215.0 lb

## 2014-03-09 DIAGNOSIS — E785 Hyperlipidemia, unspecified: Secondary | ICD-10-CM | POA: Insufficient documentation

## 2014-03-09 DIAGNOSIS — I10 Essential (primary) hypertension: Secondary | ICD-10-CM | POA: Diagnosis not present

## 2014-03-09 DIAGNOSIS — R739 Hyperglycemia, unspecified: Secondary | ICD-10-CM | POA: Diagnosis not present

## 2014-03-09 DIAGNOSIS — Z9884 Bariatric surgery status: Secondary | ICD-10-CM | POA: Diagnosis not present

## 2014-03-09 DIAGNOSIS — E559 Vitamin D deficiency, unspecified: Secondary | ICD-10-CM

## 2014-03-09 DIAGNOSIS — Z0000 Encounter for general adult medical examination without abnormal findings: Secondary | ICD-10-CM

## 2014-03-09 DIAGNOSIS — Z23 Encounter for immunization: Secondary | ICD-10-CM

## 2014-03-09 LAB — COMPREHENSIVE METABOLIC PANEL
ALBUMIN: 4 g/dL (ref 3.5–5.2)
ALK PHOS: 84 U/L (ref 39–117)
ALT: 28 U/L (ref 0–35)
AST: 22 U/L (ref 0–37)
BUN: 13 mg/dL (ref 6–23)
CALCIUM: 9.6 mg/dL (ref 8.4–10.5)
CHLORIDE: 104 meq/L (ref 96–112)
CO2: 26 mEq/L (ref 19–32)
Creatinine, Ser: 0.9 mg/dL (ref 0.4–1.2)
GFR: 68.21 mL/min (ref >60.00)
Glucose, Bld: 86 mg/dL (ref 70–99)
POTASSIUM: 5 meq/L (ref 3.5–5.1)
SODIUM: 140 meq/L (ref 135–145)
Total Bilirubin: 0.4 mg/dL (ref 0.2–1.2)
Total Protein: 7.2 g/dL (ref 6.0–8.3)

## 2014-03-09 LAB — CBC WITH DIFFERENTIAL/PLATELET
Basophils Absolute: 0 10*3/uL (ref 0.0–0.1)
Basophils Relative: 0.3 % (ref 0.0–3.0)
EOS ABS: 0.2 10*3/uL (ref 0.0–0.7)
Eosinophils Relative: 2.1 % (ref 0.0–5.0)
HCT: 44.7 % (ref 36.0–46.0)
Hemoglobin: 14.5 g/dL (ref 12.0–15.0)
Lymphocytes Relative: 38.7 % (ref 12.0–46.0)
Lymphs Abs: 2.8 10*3/uL (ref 0.7–4.0)
MCHC: 32.4 g/dL (ref 30.0–36.0)
MCV: 92.2 fl (ref 78.0–100.0)
Monocytes Absolute: 0.6 10*3/uL (ref 0.1–1.0)
Monocytes Relative: 8.7 % (ref 3.0–12.0)
NEUTROS PCT: 50.2 % (ref 43.0–77.0)
Neutro Abs: 3.7 10*3/uL (ref 1.4–7.7)
PLATELETS: 313 10*3/uL (ref 150.0–400.0)
RBC: 4.85 Mil/uL (ref 3.87–5.11)
RDW: 13.4 % (ref 11.5–15.5)
WBC: 7.3 10*3/uL (ref 4.0–10.5)

## 2014-03-09 LAB — URINALYSIS, ROUTINE W REFLEX MICROSCOPIC
BILIRUBIN URINE: NEGATIVE
HGB URINE DIPSTICK: NEGATIVE
Ketones, ur: NEGATIVE
LEUKOCYTES UA: NEGATIVE
NITRITE: NEGATIVE
RBC / HPF: NONE SEEN (ref 0–?)
Specific Gravity, Urine: 1.025 (ref 1.000–1.030)
TOTAL PROTEIN, URINE-UPE24: NEGATIVE
UROBILINOGEN UA: 0.2 (ref 0.0–1.0)
Urine Glucose: NEGATIVE
WBC, UA: NONE SEEN (ref 0–?)
pH: 6 (ref 5.0–8.0)

## 2014-03-09 LAB — FERRITIN: Ferritin: 36.1 ng/mL (ref 10.0–291.0)

## 2014-03-09 LAB — IBC PANEL
Iron: 71 ug/dL (ref 42–145)
Saturation Ratios: 20 % (ref 20.0–50.0)
Transferrin: 254.2 mg/dL (ref 212.0–360.0)

## 2014-03-09 LAB — TSH: TSH: 1.08 u[IU]/mL (ref 0.35–4.50)

## 2014-03-09 LAB — LIPID PANEL
CHOL/HDL RATIO: 3
Cholesterol: 203 mg/dL — ABNORMAL HIGH (ref 0–200)
HDL: 63.4 mg/dL (ref >39.00)
LDL CALC: 119 mg/dL — AB (ref 0–99)
NonHDL: 139.6
Triglycerides: 103 mg/dL (ref 0.0–149.0)
VLDL: 20.6 mg/dL (ref 0.0–40.0)

## 2014-03-09 LAB — FOLATE: FOLATE: 21.5 ng/mL (ref >5.9)

## 2014-03-09 LAB — VITAMIN B12: Vitamin B-12: 228 pg/mL (ref 211–911)

## 2014-03-09 LAB — VITAMIN D 25 HYDROXY (VIT D DEFICIENCY, FRACTURES): VITD: 19.96 ng/mL — AB (ref 30.00–100.00)

## 2014-03-09 LAB — HEMOGLOBIN A1C: Hgb A1c MFr Bld: 6.2 % (ref 4.6–6.5)

## 2014-03-09 MED ORDER — PNEUMOCOCCAL VAC POLYVALENT 25 MCG/0.5ML IJ INJ: 0.5000 mL | INJECTION | INTRAMUSCULAR | Status: AC

## 2014-03-09 MED ORDER — CYANOCOBALAMIN 2000 MCG PO TABS: 2000.0000 ug | ORAL_TABLET | Freq: Every day | ORAL | Status: DC

## 2014-03-09 MED ORDER — CHOLECALCIFEROL 50 MCG (2000 UT) PO TABS: 1.0000 | ORAL_TABLET | Freq: Every day | ORAL | Status: DC

## 2014-03-09 MED ORDER — AZILSARTAN MEDOXOMIL 80 MG PO TABS: 1.0000 | ORAL_TABLET | Freq: Every day | ORAL | Status: DC

## 2014-03-09 NOTE — Progress Notes (Signed)
   Subjective:    Patient ID: Shawna Ellison, female    DOB: 25-Jul-1945, 68 y.o.   MRN: [id number]  Hypertension This is a chronic problem. The current episode started more than 1 year ago. The problem has been gradually worsening since onset. The problem is uncontrolled. Pertinent negatives include no anxiety, blurred vision, chest pain, headaches, malaise/fatigue, neck pain, orthopnea, palpitations, peripheral edema, PND, shortness of breath or sweats. There are no associated agents to hypertension. Risk factors for coronary artery disease include obesity. Past treatments include diuretics. The current treatment provides mild improvement. Compliance problems include diet and exercise.       Review of Systems  Constitutional: Negative.  Negative for fever, chills, malaise/fatigue, diaphoresis, appetite change and fatigue.  HENT: Negative.   Eyes: Negative.  Negative for blurred vision.  Respiratory: Negative.  Negative for cough, choking, chest tightness, shortness of breath and stridor.   Cardiovascular: Negative.  Negative for chest pain, palpitations, orthopnea, leg swelling and PND.  Gastrointestinal: Negative.  Negative for nausea, vomiting, abdominal pain, diarrhea, constipation and blood in stool.  Endocrine: Negative.   Genitourinary: Negative.   Musculoskeletal: Negative.  Negative for back pain, arthralgias and neck pain.  Skin: Negative.   Allergic/Immunologic: Negative.   Neurological: Negative.  Negative for dizziness, syncope, speech difficulty, weakness, light-headedness and headaches.  Hematological: Negative.  Negative for adenopathy. Does not bruise/bleed easily.  Psychiatric/Behavioral: Negative.        Objective:   Physical Exam  Constitutional: She is oriented to person, place, and time. She appears well-developed and well-nourished. No distress.  HENT:  Head: Normocephalic and atraumatic.  Mouth/Throat: Oropharynx is clear and moist. No oropharyngeal exudate.    Eyes: Conjunctivae are normal. Right eye exhibits no discharge. Left eye exhibits no discharge. No scleral icterus.  Neck: Normal range of motion. Neck supple. No JVD present. No tracheal deviation present. No thyromegaly present.  Cardiovascular: Normal rate, regular rhythm, normal heart sounds and intact distal pulses.  Exam reveals no gallop and no friction rub.   No murmur heard. Pulmonary/Chest: Effort normal and breath sounds normal. No stridor. No respiratory distress. She has no wheezes. She has no rales. She exhibits no tenderness.  Abdominal: Soft. Bowel sounds are normal. She exhibits no distension and no mass. There is no tenderness. There is no rebound and no guarding.  Musculoskeletal: Normal range of motion. She exhibits no edema or tenderness.  Lymphadenopathy:    She has no cervical adenopathy.  Neurological: She is oriented to person, place, and time.  Skin: Skin is warm and dry. No rash noted. She is not diaphoretic. No erythema. No pallor.  Psychiatric: She has a normal mood and affect. Her behavior is normal. Judgment and thought content normal.  Vitals reviewed.    Lab Results  Component Value Date   WBC 6.0 03/10/2013   HGB 14.6 03/10/2013   HCT 42.7 03/10/2013   PLT 319.0 03/10/2013   GLUCOSE 88 03/10/2013   CHOL 185 03/10/2013   TRIG 76.0 03/10/2013   HDL 60.30 03/10/2013   LDLCALC 110* 03/10/2013   ALT 23 03/10/2013   AST 21 03/10/2013   NA 140 03/10/2013   K 3.9 03/10/2013   CL 103 03/10/2013   CREATININE 0.7 03/10/2013   BUN 9 03/10/2013   CO2 31 03/10/2013   TSH 1.08 03/10/2013   HGBA1C 5.7 03/10/2013       Assessment & Plan:

## 2014-03-09 NOTE — Assessment & Plan Note (Signed)
She is due for a FLP today

## 2014-03-09 NOTE — Assessment & Plan Note (Signed)
I will recheck her A1C to see if she has developed DM2  

## 2014-03-09 NOTE — Addendum Note (Signed)
Addended by: Janith Lima on: 03/09/2014 05:06 PM   Modules accepted: Orders

## 2014-03-09 NOTE — Assessment & Plan Note (Signed)

## 2014-03-09 NOTE — Assessment & Plan Note (Signed)
Will recheck her labs today to screen for deficiencies, malabsorption, etc

## 2014-03-09 NOTE — Patient Instructions (Signed)
Preventive Care for Adults A healthy lifestyle and preventive care can promote health and wellness. Preventive health guidelines for women include the following key practices.  A routine yearly physical is a good way to check with your health care provider about your health and preventive screening. It is a chance to share any concerns and updates on your health and to receive a thorough exam.  Visit your dentist for a routine exam and preventive care every 6 months. Brush your teeth twice a day and floss once a day. Good oral hygiene prevents tooth decay and gum disease.  The frequency of eye exams is based on your age, health, family medical history, use of contact lenses, and other factors. Follow your health care provider's recommendations for frequency of eye exams.  Eat a healthy diet. Foods like vegetables, fruits, whole grains, low-fat dairy products, and lean protein foods contain the nutrients you need without too many calories. Decrease your intake of foods high in solid fats, added sugars, and salt. Eat the right amount of calories for you.Get information about a proper diet from your health care provider, if necessary.  Regular physical exercise is one of the most important things you can do for your health. Most adults should get at least 150 minutes of moderate-intensity exercise (any activity that increases your heart rate and causes you to sweat) each week. In addition, most adults need muscle-strengthening exercises on 2 or more days a week.  Maintain a healthy weight. The body mass index (BMI) is a screening tool to identify possible weight problems. It provides an estimate of body fat based on height and weight. Your health care provider can find your BMI and can help you achieve or maintain a healthy weight.For adults 20 years and older:  A BMI below 18.5 is considered underweight.  A BMI of 18.5 to 24.9 is normal.  A BMI of 25 to 29.9 is considered overweight.  A BMI of  30 and above is considered obese.  Maintain normal blood lipids and cholesterol levels by exercising and minimizing your intake of saturated fat. Eat a balanced diet with plenty of fruit and vegetables. Blood tests for lipids and cholesterol should begin at age 76 and be repeated every 5 years. If your lipid or cholesterol levels are high, you are over 50, or you are at high risk for heart disease, you may need your cholesterol levels checked more frequently.Ongoing high lipid and cholesterol levels should be treated with medicines if diet and exercise are not working.  If you smoke, find out from your health care provider how to quit. If you do not use tobacco, do not start.  Lung cancer screening is recommended for adults aged 22-80 years who are at high risk for developing lung cancer because of a history of smoking. A yearly low-dose CT scan of the lungs is recommended for people who have at least a 30-pack-year history of smoking and are a current smoker or have quit within the past 15 years. A pack year of smoking is smoking an average of 1 pack of cigarettes a day for 1 year (for example: 1 pack a day for 30 years or 2 packs a day for 15 years). Yearly screening should continue until the smoker has stopped smoking for at least 15 years. Yearly screening should be stopped for people who develop a health problem that would prevent them from having lung cancer treatment.  If you are pregnant, do not drink alcohol. If you are breastfeeding,  be very cautious about drinking alcohol. If you are not pregnant and choose to drink alcohol, do not have more than 1 drink per day. One drink is considered to be 12 ounces (355 mL) of beer, 5 ounces (148 mL) of wine, or 1.5 ounces (44 mL) of liquor.  Avoid use of street drugs. Do not share needles with anyone. Ask for help if you need support or instructions about stopping the use of drugs.  High blood pressure causes heart disease and increases the risk of  stroke. Your blood pressure should be checked at least every 1 to 2 years. Ongoing high blood pressure should be treated with medicines if weight loss and exercise do not work.  If you are 3-86 years old, ask your health care provider if you should take aspirin to prevent strokes.  Diabetes screening involves taking a blood sample to check your fasting blood sugar level. This should be done once every 3 years, after age 67, if you are within normal weight and without risk factors for diabetes. Testing should be considered at a younger age or be carried out more frequently if you are overweight and have at least 1 risk factor for diabetes.  Breast cancer screening is essential preventive care for women. You should practice "breast self-awareness." This means understanding the normal appearance and feel of your breasts and may include breast self-examination. Any changes detected, no matter how small, should be reported to a health care provider. Women in their 8s and 30s should have a clinical breast exam (CBE) by a health care provider as part of a regular health exam every 1 to 3 years. After age 70, women should have a CBE every year. Starting at age 25, women should consider having a mammogram (breast X-ray test) every year. Women who have a family history of breast cancer should talk to their health care provider about genetic screening. Women at a high risk of breast cancer should talk to their health care providers about having an MRI and a mammogram every year.  Breast cancer gene (BRCA)-related cancer risk assessment is recommended for women who have family members with BRCA-related cancers. BRCA-related cancers include breast, ovarian, tubal, and peritoneal cancers. Having family members with these cancers may be associated with an increased risk for harmful changes (mutations) in the breast cancer genes BRCA1 and BRCA2. Results of the assessment will determine the need for genetic counseling and  BRCA1 and BRCA2 testing.  Routine pelvic exams to screen for cancer are no longer recommended for nonpregnant women who are considered low risk for cancer of the pelvic organs (ovaries, uterus, and vagina) and who do not have symptoms. Ask your health care provider if a screening pelvic exam is right for you.  If you have had past treatment for cervical cancer or a condition that could lead to cancer, you need Pap tests and screening for cancer for at least 20 years after your treatment. If Pap tests have been discontinued, your risk factors (such as having a new sexual partner) need to be reassessed to determine if screening should be resumed. Some women have medical problems that increase the chance of getting cervical cancer. In these cases, your health care provider may recommend more frequent screening and Pap tests.  The HPV test is an additional test that may be used for cervical cancer screening. The HPV test looks for the virus that can cause the cell changes on the cervix. The cells collected during the Pap test can be  tested for HPV. The HPV test could be used to screen women aged 30 years and older, and should be used in women of any age who have unclear Pap test results. After the age of 30, women should have HPV testing at the same frequency as a Pap test.  Colorectal cancer can be detected and often prevented. Most routine colorectal cancer screening begins at the age of 50 years and continues through age 75 years. However, your health care provider may recommend screening at an earlier age if you have risk factors for colon cancer. On a yearly basis, your health care provider may provide home test kits to check for hidden blood in the stool. Use of a small camera at the end of a tube, to directly examine the colon (sigmoidoscopy or colonoscopy), can detect the earliest forms of colorectal cancer. Talk to your health care provider about this at age 50, when routine screening begins. Direct  exam of the colon should be repeated every 5-10 years through age 75 years, unless early forms of pre-cancerous polyps or small growths are found.  People who are at an increased risk for hepatitis B should be screened for this virus. You are considered at high risk for hepatitis B if:  You were born in a country where hepatitis B occurs often. Talk with your health care provider about which countries are considered high risk.  Your parents were born in a high-risk country and you have not received a shot to protect against hepatitis B (hepatitis B vaccine).  You have HIV or AIDS.  You use needles to inject street drugs.  You live with, or have sex with, someone who has hepatitis B.  You get hemodialysis treatment.  You take certain medicines for conditions like cancer, organ transplantation, and autoimmune conditions.  Hepatitis C blood testing is recommended for all people born from 1945 through 1965 and any individual with known risks for hepatitis C.  Practice safe sex. Use condoms and avoid high-risk sexual practices to reduce the spread of sexually transmitted infections (STIs). STIs include gonorrhea, chlamydia, syphilis, trichomonas, herpes, HPV, and human immunodeficiency virus (HIV). Herpes, HIV, and HPV are viral illnesses that have no cure. They can result in disability, cancer, and death.  You should be screened for sexually transmitted illnesses (STIs) including gonorrhea and chlamydia if:  You are sexually active and are younger than 24 years.  You are older than 24 years and your health care provider tells you that you are at risk for this type of infection.  Your sexual activity has changed since you were last screened and you are at an increased risk for chlamydia or gonorrhea. Ask your health care provider if you are at risk.  If you are at risk of being infected with HIV, it is recommended that you take a prescription medicine daily to prevent HIV infection. This is  called preexposure prophylaxis (PrEP). You are considered at risk if:  You are a heterosexual woman, are sexually active, and are at increased risk for HIV infection.  You take drugs by injection.  You are sexually active with a partner who has HIV.  Talk with your health care provider about whether you are at high risk of being infected with HIV. If you choose to begin PrEP, you should first be tested for HIV. You should then be tested every 3 months for as long as you are taking PrEP.  Osteoporosis is a disease in which the bones lose minerals and strength   with aging. This can result in serious bone fractures or breaks. The risk of osteoporosis can be identified using a bone density scan. Women ages 65 years and over and women at risk for fractures or osteoporosis should discuss screening with their health care providers. Ask your health care provider whether you should take a calcium supplement or vitamin D to reduce the rate of osteoporosis.  Menopause can be associated with physical symptoms and risks. Hormone replacement therapy is available to decrease symptoms and risks. You should talk to your health care provider about whether hormone replacement therapy is right for you.  Use sunscreen. Apply sunscreen liberally and repeatedly throughout the day. You should seek shade when your shadow is shorter than you. Protect yourself by wearing long sleeves, pants, a wide-brimmed hat, and sunglasses year round, whenever you are outdoors.  Once a month, do a whole body skin exam, using a mirror to look at the skin on your back. Tell your health care provider of new moles, moles that have irregular borders, moles that are larger than a pencil eraser, or moles that have changed in shape or color.  Stay current with required vaccines (immunizations).  Influenza vaccine. All adults should be immunized every year.  Tetanus, diphtheria, and acellular pertussis (Td, Tdap) vaccine. Pregnant women should  receive 1 dose of Tdap vaccine during each pregnancy. The dose should be obtained regardless of the length of time since the last dose. Immunization is preferred during the 27th-36th week of gestation. An adult who has not previously received Tdap or who does not know her vaccine status should receive 1 dose of Tdap. This initial dose should be followed by tetanus and diphtheria toxoids (Td) booster doses every 10 years. Adults with an unknown or incomplete history of completing a 3-dose immunization series with Td-containing vaccines should begin or complete a primary immunization series including a Tdap dose. Adults should receive a Td booster every 10 years.  Varicella vaccine. An adult without evidence of immunity to varicella should receive 2 doses or a second dose if she has previously received 1 dose. Pregnant females who do not have evidence of immunity should receive the first dose after pregnancy. This first dose should be obtained before leaving the health care facility. The second dose should be obtained 4-8 weeks after the first dose.  Human papillomavirus (HPV) vaccine. Females aged 13-26 years who have not received the vaccine previously should obtain the 3-dose series. The vaccine is not recommended for use in pregnant females. However, pregnancy testing is not needed before receiving a dose. If a female is found to be pregnant after receiving a dose, no treatment is needed. In that case, the remaining doses should be delayed until after the pregnancy. Immunization is recommended for any person with an immunocompromised condition through the age of 26 years if she did not get any or all doses earlier. During the 3-dose series, the second dose should be obtained 4-8 weeks after the first dose. The third dose should be obtained 24 weeks after the first dose and 16 weeks after the second dose.  Zoster vaccine. One dose is recommended for adults aged 60 years or older unless certain conditions are  present.  Measles, mumps, and rubella (MMR) vaccine. Adults born before 1957 generally are considered immune to measles and mumps. Adults born in 1957 or later should have 1 or more doses of MMR vaccine unless there is a contraindication to the vaccine or there is laboratory evidence of immunity to   each of the three diseases. A routine second dose of MMR vaccine should be obtained at least 28 days after the first dose for students attending postsecondary schools, health care workers, or international travelers. People who received inactivated measles vaccine or an unknown type of measles vaccine during 1963-1967 should receive 2 doses of MMR vaccine. People who received inactivated mumps vaccine or an unknown type of mumps vaccine before 1979 and are at high risk for mumps infection should consider immunization with 2 doses of MMR vaccine. For females of childbearing age, rubella immunity should be determined. If there is no evidence of immunity, females who are not pregnant should be vaccinated. If there is no evidence of immunity, females who are pregnant should delay immunization until after pregnancy. Unvaccinated health care workers born before 1957 who lack laboratory evidence of measles, mumps, or rubella immunity or laboratory confirmation of disease should consider measles and mumps immunization with 2 doses of MMR vaccine or rubella immunization with 1 dose of MMR vaccine.  Pneumococcal 13-valent conjugate (PCV13) vaccine. When indicated, a person who is uncertain of her immunization history and has no record of immunization should receive the PCV13 vaccine. An adult aged 19 years or older who has certain medical conditions and has not been previously immunized should receive 1 dose of PCV13 vaccine. This PCV13 should be followed with a dose of pneumococcal polysaccharide (PPSV23) vaccine. The PPSV23 vaccine dose should be obtained at least 8 weeks after the dose of PCV13 vaccine. An adult aged 19  years or older who has certain medical conditions and previously received 1 or more doses of PPSV23 vaccine should receive 1 dose of PCV13. The PCV13 vaccine dose should be obtained 1 or more years after the last PPSV23 vaccine dose.  Pneumococcal polysaccharide (PPSV23) vaccine. When PCV13 is also indicated, PCV13 should be obtained first. All adults aged 65 years and older should be immunized. An adult younger than age 65 years who has certain medical conditions should be immunized. Any person who resides in a nursing home or long-term care facility should be immunized. An adult smoker should be immunized. People with an immunocompromised condition and certain other conditions should receive both PCV13 and PPSV23 vaccines. People with human immunodeficiency virus (HIV) infection should be immunized as soon as possible after diagnosis. Immunization during chemotherapy or radiation therapy should be avoided. Routine use of PPSV23 vaccine is not recommended for American Indians, [place] Natives, or people younger than 65 years unless there are medical conditions that require PPSV23 vaccine. When indicated, people who have unknown immunization and have no record of immunization should receive PPSV23 vaccine. One-time revaccination 5 years after the first dose of PPSV23 is recommended for people aged 19-64 years who have chronic kidney failure, nephrotic syndrome, asplenia, or immunocompromised conditions. People who received 1-2 doses of PPSV23 before age 65 years should receive another dose of PPSV23 vaccine at age 65 years or later if at least 5 years have passed since the previous dose. Doses of PPSV23 are not needed for people immunized with PPSV23 at or after age 65 years.  Meningococcal vaccine. Adults with asplenia or persistent complement component deficiencies should receive 2 doses of quadrivalent meningococcal conjugate (MenACWY-D) vaccine. The doses should be obtained at least 2 months apart.  Microbiologists working with certain meningococcal bacteria, military recruits, people at risk during an outbreak, and people who travel to or live in countries with a high rate of meningitis should be immunized. A first-year college student up through age   21 years who is living in a residence hall should receive a dose if she did not receive a dose on or after her 16th birthday. Adults who have certain high-risk conditions should receive one or more doses of vaccine.  Hepatitis A vaccine. Adults who wish to be protected from this disease, have certain high-risk conditions, work with hepatitis A-infected animals, work in hepatitis A research labs, or travel to or work in countries with a high rate of hepatitis A should be immunized. Adults who were previously unvaccinated and who anticipate close contact with an international adoptee during the first 60 days after arrival in the [place] from a country with a high rate of hepatitis A should be immunized.  Hepatitis B vaccine. Adults who wish to be protected from this disease, have certain high-risk conditions, may be exposed to blood or other infectious body fluids, are household contacts or sex partners of hepatitis B positive people, are clients or workers in certain care facilities, or travel to or work in countries with a high rate of hepatitis B should be immunized.  Haemophilus influenzae type b (Hib) vaccine. A previously unvaccinated person with asplenia or sickle cell disease or having a scheduled splenectomy should receive 1 dose of Hib vaccine. Regardless of previous immunization, a recipient of a hematopoietic stem cell transplant should receive a 3-dose series 6-12 months after her successful transplant. Hib vaccine is not recommended for adults with HIV infection. Preventive Services / Frequency Ages 64 to 68 years  Blood pressure check.** / Every 1 to 2 years.  Lipid and cholesterol check.** / Every 5 years beginning at age  22.  Clinical breast exam.** / Every 3 years for women in their 88s and 53s.  BRCA-related cancer risk assessment.** / For women who have family members with a BRCA-related cancer (breast, ovarian, tubal, or peritoneal cancers).  Pap test.** / Every 2 years from ages 90 through 51. Every 3 years starting at age 21 through age 56 or 3 with a history of 3 consecutive normal Pap tests.  HPV screening.** / Every 3 years from ages 24 through ages 1 to 46 with a history of 3 consecutive normal Pap tests.  Hepatitis C blood test.** / For any individual with known risks for hepatitis C.  Skin self-exam. / Monthly.  Influenza vaccine. / Every year.  Tetanus, diphtheria, and acellular pertussis (Tdap, Td) vaccine.** / Consult your health care provider. Pregnant women should receive 1 dose of Tdap vaccine during each pregnancy. 1 dose of Td every 10 years.  Varicella vaccine.** / Consult your health care provider. Pregnant females who do not have evidence of immunity should receive the first dose after pregnancy.  HPV vaccine. / 3 doses over 6 months, if 72 and younger. The vaccine is not recommended for use in pregnant females. However, pregnancy testing is not needed before receiving a dose.  Measles, mumps, rubella (MMR) vaccine.** / You need at least 1 dose of MMR if you were born in 1957 or later. You may also need a 2nd dose. For females of childbearing age, rubella immunity should be determined. If there is no evidence of immunity, females who are not pregnant should be vaccinated. If there is no evidence of immunity, females who are pregnant should delay immunization until after pregnancy.  Pneumococcal 13-valent conjugate (PCV13) vaccine.** / Consult your health care provider.  Pneumococcal polysaccharide (PPSV23) vaccine.** / 1 to 2 doses if you smoke cigarettes or if you have certain conditions.  Meningococcal vaccine.** /  1 dose if you are age 19 to 21 years and a first-year college  student living in a residence hall, or have one of several medical conditions, you need to get vaccinated against meningococcal disease. You may also need additional booster doses.  Hepatitis A vaccine.** / Consult your health care provider.  Hepatitis B vaccine.** / Consult your health care provider.  Haemophilus influenzae type b (Hib) vaccine.** / Consult your health care provider. Ages 40 to 64 years  Blood pressure check.** / Every 1 to 2 years.  Lipid and cholesterol check.** / Every 5 years beginning at age 20 years.  Lung cancer screening. / Every year if you are aged 55-80 years and have a 30-pack-year history of smoking and currently smoke or have quit within the past 15 years. Yearly screening is stopped once you have quit smoking for at least 15 years or develop a health problem that would prevent you from having lung cancer treatment.  Clinical breast exam.** / Every year after age 40 years.  BRCA-related cancer risk assessment.** / For women who have family members with a BRCA-related cancer (breast, ovarian, tubal, or peritoneal cancers).  Mammogram.** / Every year beginning at age 40 years and continuing for as long as you are in good health. Consult with your health care provider.  Pap test.** / Every 3 years starting at age 30 years through age 65 or 70 years with a history of 3 consecutive normal Pap tests.  HPV screening.** / Every 3 years from ages 30 years through ages 65 to 70 years with a history of 3 consecutive normal Pap tests.  Fecal occult blood test (FOBT) of stool. / Every year beginning at age 50 years and continuing until age 75 years. You may not need to do this test if you get a colonoscopy every 10 years.  Flexible sigmoidoscopy or colonoscopy.** / Every 5 years for a flexible sigmoidoscopy or every 10 years for a colonoscopy beginning at age 50 years and continuing until age 75 years.  Hepatitis C blood test.** / For all people born from 1945 through  1965 and any individual with known risks for hepatitis C.  Skin self-exam. / Monthly.  Influenza vaccine. / Every year.  Tetanus, diphtheria, and acellular pertussis (Tdap/Td) vaccine.** / Consult your health care provider. Pregnant women should receive 1 dose of Tdap vaccine during each pregnancy. 1 dose of Td every 10 years.  Varicella vaccine.** / Consult your health care provider. Pregnant females who do not have evidence of immunity should receive the first dose after pregnancy.  Zoster vaccine.** / 1 dose for adults aged 60 years or older.  Measles, mumps, rubella (MMR) vaccine.** / You need at least 1 dose of MMR if you were born in 1957 or later. You may also need a 2nd dose. For females of childbearing age, rubella immunity should be determined. If there is no evidence of immunity, females who are not pregnant should be vaccinated. If there is no evidence of immunity, females who are pregnant should delay immunization until after pregnancy.  Pneumococcal 13-valent conjugate (PCV13) vaccine.** / Consult your health care provider.  Pneumococcal polysaccharide (PPSV23) vaccine.** / 1 to 2 doses if you smoke cigarettes or if you have certain conditions.  Meningococcal vaccine.** / Consult your health care provider.  Hepatitis A vaccine.** / Consult your health care provider.  Hepatitis B vaccine.** / Consult your health care provider.  Haemophilus influenzae type b (Hib) vaccine.** / Consult your health care provider. Ages 65   years and over  Blood pressure check.** / Every 1 to 2 years.  Lipid and cholesterol check.** / Every 5 years beginning at age 43 years.  Lung cancer screening. / Every year if you are aged 35-80 years and have a 30-pack-year history of smoking and currently smoke or have quit within the past 15 years. Yearly screening is stopped once you have quit smoking for at least 15 years or develop a health problem that would prevent you from having lung cancer  treatment.  Clinical breast exam.** / Every year after age 46 years.  BRCA-related cancer risk assessment.** / For women who have family members with a BRCA-related cancer (breast, ovarian, tubal, or peritoneal cancers).  Mammogram.** / Every year beginning at age 11 years and continuing for as long as you are in good health. Consult with your health care provider.  Pap test.** / Every 3 years starting at age 16 years through age 46 or 64 years with 3 consecutive normal Pap tests. Testing can be stopped between 65 and 70 years with 3 consecutive normal Pap tests and no abnormal Pap or HPV tests in the past 10 years.  HPV screening.** / Every 3 years from ages 50 years through ages 87 or 38 years with a history of 3 consecutive normal Pap tests. Testing can be stopped between 65 and 70 years with 3 consecutive normal Pap tests and no abnormal Pap or HPV tests in the past 10 years.  Fecal occult blood test (FOBT) of stool. / Every year beginning at age 26 years and continuing until age 76 years. You may not need to do this test if you get a colonoscopy every 10 years.  Flexible sigmoidoscopy or colonoscopy.** / Every 5 years for a flexible sigmoidoscopy or every 10 years for a colonoscopy beginning at age 64 years and continuing until age 58 years.  Hepatitis C blood test.** / For all people born from [birth year] through 1963 and any individual with known risks for hepatitis C.  Osteoporosis screening.** / A one-time screening for women ages 25 years and over and women at risk for fractures or osteoporosis.  Skin self-exam. / Monthly.  Influenza vaccine. / Every year.  Tetanus, diphtheria, and acellular pertussis (Tdap/Td) vaccine.** / 1 dose of Td every 10 years.  Varicella vaccine.** / Consult your health care provider.  Zoster vaccine.** / 1 dose for adults aged 62 years or older.  Pneumococcal 13-valent conjugate (PCV13) vaccine.** / Consult your health care provider.  Pneumococcal  polysaccharide (PPSV23) vaccine.** / 1 dose for all adults aged 45 years and older.  Meningococcal vaccine.** / Consult your health care provider.  Hepatitis A vaccine.** / Consult your health care provider.  Hepatitis B vaccine.** / Consult your health care provider.  Haemophilus influenzae type b (Hib) vaccine.** / Consult your health care provider. ** Family history and personal history of risk and conditions may change your health care provider's recommendations. Document Released: 05/06/2001 Document Revised: 07/25/2013 Document Reviewed: 08/05/2010 [facility] Patient Information 2015 [place], [place]. This information is not intended to replace advice given to you by your health care provider. Make sure you discuss any questions you have with your health care provider. Hypertension Hypertension, commonly called high blood pressure, is when the force of blood pumping through your arteries is too strong. Your arteries are the blood vessels that carry blood from your heart throughout your body. A blood pressure reading consists of a higher number over a lower number, such as 110/72. The higher number (  systolic) is the pressure inside your arteries when your heart pumps. The lower number (diastolic) is the pressure inside your arteries when your heart relaxes. Ideally you want your blood pressure below 120/80. Hypertension forces your heart to work harder to pump blood. Your arteries may become narrow or stiff. Having hypertension puts you at risk for heart disease, stroke, and other problems.  RISK FACTORS Some risk factors for high blood pressure are controllable. Others are not.  Risk factors you cannot control include:   Race. You may be at higher risk if you are African American.  Age. Risk increases with age.  Gender. Men are at higher risk than women before age 35 years. After age 25, women are at higher risk than men. Risk factors you can control include:  Not getting enough exercise  or physical activity.  Being overweight.  Getting too much fat, sugar, calories, or salt in your diet.  Drinking too much alcohol. SIGNS AND SYMPTOMS Hypertension does not usually cause signs or symptoms. Extremely high blood pressure (hypertensive crisis) may cause headache, anxiety, shortness of breath, and nosebleed. DIAGNOSIS  To check if you have hypertension, your health care provider will measure your blood pressure while you are seated, with your arm held at the level of your heart. It should be measured at least twice using the same arm. Certain conditions can cause a difference in blood pressure between your right and left arms. A blood pressure reading that is higher than normal on one occasion does not mean that you need treatment. If one blood pressure reading is high, ask your health care provider about having it checked again. TREATMENT  Treating high blood pressure includes making lifestyle changes and possibly taking medicine. Living a healthy lifestyle can help lower high blood pressure. You may need to change some of your habits. Lifestyle changes may include:  Following the DASH diet. This diet is high in fruits, vegetables, and whole grains. It is low in salt, red meat, and added sugars.  Getting at least 2 hours of brisk physical activity every week.  Losing weight if necessary.  Not smoking.  Limiting alcoholic beverages.  Learning ways to reduce stress. If lifestyle changes are not enough to get your blood pressure under control, your health care provider may prescribe medicine. You may need to take more than one. Work closely with your health care provider to understand the risks and benefits. HOME CARE INSTRUCTIONS  Have your blood pressure rechecked as directed by your health care provider.   Take medicines only as directed by your health care provider. Follow the directions carefully. Blood pressure medicines must be taken as prescribed. The medicine does  not work as well when you skip doses. Skipping doses also puts you at risk for problems.   Do not smoke.   Monitor your blood pressure at home as directed by your health care provider. SEEK MEDICAL CARE IF:   You think you are having a reaction to medicines taken.  You have recurrent headaches or feel dizzy.  You have swelling in your ankles.  You have trouble with your vision. SEEK IMMEDIATE MEDICAL CARE IF:  You develop a severe headache or confusion.  You have unusual weakness, numbness, or feel faint.  You have severe chest or abdominal pain.  You vomit repeatedly.  You have trouble breathing. MAKE SURE YOU:   Understand these instructions.  Will watch your condition.  Will get help right away if you are not doing well or get  worse. Document Released: 03/10/2005 Document Revised: 07/25/2013 Document Reviewed: 12/31/2012 [facility] Patient Information 2015 [place], [place]. This information is not intended to replace advice given to you by your health care provider. Make sure you discuss any questions you have with your health care provider.

## 2014-03-09 NOTE — Assessment & Plan Note (Signed)
Her BP is not well controlled She will work on her lifestyle modifications and will start an ARB for better BP control Will check her lytes and renal function

## 2014-03-10 LAB — VITAMIN B1

## 2014-03-13 NOTE — Addendum Note (Signed)
Addended by: Gala Lewandowsky B on: 03/13/2014 02:59 PM   Modules accepted: Orders

## 2014-03-14 ENCOUNTER — Other Ambulatory Visit: Payer: Self-pay | Admitting: *Deleted

## 2014-03-14 ENCOUNTER — Other Ambulatory Visit: Payer: Commercial Managed Care - HMO

## 2014-03-14 DIAGNOSIS — Z9884 Bariatric surgery status: Secondary | ICD-10-CM

## 2014-03-18 LAB — VITAMIN B1: Vitamin B1 (Thiamine): 33 nmol/L — ABNORMAL HIGH (ref 8–30)

## 2014-03-19 ENCOUNTER — Encounter: Payer: Self-pay | Admitting: Internal Medicine

## 2014-03-22 ENCOUNTER — Encounter: Payer: Self-pay | Admitting: Internal Medicine

## 2014-03-22 NOTE — Telephone Encounter (Signed)
Please contact pt to schedule bone density...Shawna Ellison

## 2014-04-10 DIAGNOSIS — M1712 Unilateral primary osteoarthritis, left knee: Secondary | ICD-10-CM | POA: Diagnosis not present

## 2014-04-10 DIAGNOSIS — M170 Bilateral primary osteoarthritis of knee: Secondary | ICD-10-CM | POA: Diagnosis not present

## 2014-04-10 DIAGNOSIS — M1711 Unilateral primary osteoarthritis, right knee: Secondary | ICD-10-CM | POA: Diagnosis not present

## 2014-04-13 ENCOUNTER — Other Ambulatory Visit (INDEPENDENT_AMBULATORY_CARE_PROVIDER_SITE_OTHER): Payer: Self-pay

## 2014-04-13 DIAGNOSIS — Z9884 Bariatric surgery status: Secondary | ICD-10-CM | POA: Diagnosis not present

## 2014-04-20 ENCOUNTER — Ambulatory Visit: Payer: Medicare Other | Admitting: *Deleted

## 2014-04-20 ENCOUNTER — Other Ambulatory Visit: Payer: Self-pay | Admitting: Internal Medicine

## 2014-04-20 ENCOUNTER — Encounter: Payer: Self-pay | Admitting: *Deleted

## 2014-04-20 VITALS — BP 122/72

## 2014-04-20 DIAGNOSIS — I10 Essential (primary) hypertension: Secondary | ICD-10-CM

## 2014-04-20 MED ORDER — AZILSARTAN MEDOXOMIL 80 MG PO TABS: 1.0000 | ORAL_TABLET | Freq: Every day | ORAL | Status: DC

## 2014-04-21 ENCOUNTER — Other Ambulatory Visit: Payer: Self-pay | Admitting: Internal Medicine

## 2014-04-21 ENCOUNTER — Encounter: Payer: Self-pay | Admitting: Internal Medicine

## 2014-04-21 DIAGNOSIS — F450 Somatization disorder: Principal | ICD-10-CM

## 2014-04-21 DIAGNOSIS — F329 Major depressive disorder, single episode, unspecified: Secondary | ICD-10-CM

## 2014-04-21 MED ORDER — DULOXETINE HCL 20 MG PO CPEP: 20.0000 mg | ORAL_CAPSULE | Freq: Two times a day (BID) | ORAL | Status: DC

## 2014-04-24 DIAGNOSIS — L821 Other seborrheic keratosis: Secondary | ICD-10-CM | POA: Diagnosis not present

## 2014-04-24 DIAGNOSIS — L570 Actinic keratosis: Secondary | ICD-10-CM | POA: Diagnosis not present

## 2014-04-24 DIAGNOSIS — L718 Other rosacea: Secondary | ICD-10-CM | POA: Diagnosis not present

## 2014-04-25 ENCOUNTER — Telehealth: Payer: Self-pay | Admitting: Internal Medicine

## 2014-04-25 NOTE — Telephone Encounter (Signed)
emmi emailed °

## 2014-04-26 ENCOUNTER — Ambulatory Visit (HOSPITAL_COMMUNITY)
Admission: RE | Admit: 2014-04-26 | Discharge: 2014-04-26 | Disposition: A | Discharge status: Home / Self Care | Payer: Medicare Other | Source: Ambulatory Visit | Attending: Surgery | Admitting: Surgery

## 2014-04-26 DIAGNOSIS — Z01818 Encounter for other preprocedural examination: Secondary | ICD-10-CM | POA: Diagnosis present

## 2014-04-26 DIAGNOSIS — K219 Gastro-esophageal reflux disease without esophagitis: Secondary | ICD-10-CM | POA: Diagnosis not present

## 2014-04-26 DIAGNOSIS — K449 Diaphragmatic hernia without obstruction or gangrene: Secondary | ICD-10-CM | POA: Diagnosis not present

## 2014-04-26 DIAGNOSIS — Z9884 Bariatric surgery status: Secondary | ICD-10-CM | POA: Diagnosis not present

## 2014-05-10 ENCOUNTER — Other Ambulatory Visit (INDEPENDENT_AMBULATORY_CARE_PROVIDER_SITE_OTHER): Payer: Medicare Other

## 2014-05-10 ENCOUNTER — Ambulatory Visit (INDEPENDENT_AMBULATORY_CARE_PROVIDER_SITE_OTHER): Payer: Medicare Other | Admitting: Internal Medicine

## 2014-05-10 ENCOUNTER — Encounter: Payer: Self-pay | Admitting: Internal Medicine

## 2014-05-10 VITALS — BP 118/76 | HR 89 | Temp 97.8°F | Wt 216.0 lb

## 2014-05-10 DIAGNOSIS — M8580 Other specified disorders of bone density and structure, unspecified site: Secondary | ICD-10-CM | POA: Diagnosis not present

## 2014-05-10 DIAGNOSIS — E559 Vitamin D deficiency, unspecified: Secondary | ICD-10-CM

## 2014-05-10 DIAGNOSIS — I10 Essential (primary) hypertension: Secondary | ICD-10-CM

## 2014-05-10 DIAGNOSIS — E538 Deficiency of other specified B group vitamins: Secondary | ICD-10-CM | POA: Diagnosis not present

## 2014-05-10 LAB — COMPREHENSIVE METABOLIC PANEL WITH GFR
ALT: 23 U/L (ref 0–35)
AST: 16 U/L (ref 0–37)
Albumin: 3.9 g/dL (ref 3.5–5.2)
Alkaline Phosphatase: 80 U/L (ref 39–117)
BUN: 17 mg/dL (ref 6–23)
CO2: 32 meq/L (ref 19–32)
Calcium: 9.6 mg/dL (ref 8.4–10.5)
Chloride: 101 meq/L (ref 96–112)
Creatinine, Ser: 0.86 mg/dL (ref 0.40–1.20)
GFR: 70 mL/min
Glucose, Bld: 104 mg/dL — ABNORMAL HIGH (ref 70–99)
Potassium: 5 meq/L (ref 3.5–5.1)
Sodium: 137 meq/L (ref 135–145)
Total Bilirubin: 0.5 mg/dL (ref 0.2–1.2)
Total Protein: 7.4 g/dL (ref 6.0–8.3)

## 2014-05-10 LAB — PHOSPHORUS: Phosphorus: 3.6 mg/dL (ref 2.3–4.6)

## 2014-05-10 MED ORDER — VALSARTAN 160 MG PO TABS: 160.0000 mg | ORAL_TABLET | Freq: Every day | ORAL | Status: DC

## 2014-05-10 NOTE — Progress Notes (Signed)
   Subjective:    Patient ID: Shawna Ellison, female    DOB: 1945-05-12, 69 y.o.   MRN: [id number]  Hypertension This is a chronic problem. The current episode started more than 1 year ago. The problem is unchanged. The problem is controlled. Pertinent negatives include no anxiety, blurred vision, chest pain, headaches, malaise/fatigue, neck pain, orthopnea, palpitations, peripheral edema, PND, shortness of breath or sweats. There are no associated agents to hypertension. Past treatments include diuretics and angiotensin blockers. The current treatment provides moderate improvement. Compliance problems include medication cost, diet and exercise.       Review of Systems  Constitutional: Negative.  Negative for fever, chills, malaise/fatigue, diaphoresis, appetite change and fatigue.  HENT: Negative.   Eyes: Negative.  Negative for blurred vision.  Respiratory: Negative.  Negative for cough, choking, chest tightness, shortness of breath and stridor.   Cardiovascular: Negative.  Negative for chest pain, palpitations, orthopnea, leg swelling and PND.  Gastrointestinal: Negative.  Negative for nausea, vomiting, abdominal pain, diarrhea, constipation and blood in stool.  Endocrine: Negative.   Genitourinary: Negative.  Negative for dysuria, frequency, hematuria and difficulty urinating.  Musculoskeletal: Negative.  Negative for arthralgias and neck pain.  Skin: Negative.  Negative for rash.  Allergic/Immunologic: Negative.   Neurological: Negative.  Negative for headaches.  Hematological: Negative.  Negative for adenopathy. Does not bruise/bleed easily.  Psychiatric/Behavioral: Negative.        Objective:   Physical Exam  Constitutional: She is oriented to person, place, and time. She appears well-developed and well-nourished. No distress.  HENT:  Head: Normocephalic and atraumatic.  Mouth/Throat: Oropharynx is clear and moist. No oropharyngeal exudate.  Eyes: Conjunctivae are normal. Right  eye exhibits no discharge. Left eye exhibits no discharge. No scleral icterus.  Neck: Normal range of motion. Neck supple. No JVD present. No tracheal deviation present. No thyromegaly present.  Cardiovascular: Normal rate, regular rhythm, normal heart sounds and intact distal pulses.  Exam reveals no gallop and no friction rub.   No murmur heard. Pulmonary/Chest: Effort normal and breath sounds normal. No stridor. No respiratory distress. She has no wheezes. She has no rales. She exhibits no tenderness.  Abdominal: Soft. Bowel sounds are normal. She exhibits no distension and no mass. There is no tenderness. There is no rebound and no guarding.  Musculoskeletal: Normal range of motion. She exhibits no edema or tenderness.  Lymphadenopathy:    She has no cervical adenopathy.  Neurological: She is oriented to person, place, and time.  Skin: Skin is warm and dry. No rash noted. She is not diaphoretic. No erythema.  Vitals reviewed.    Lab Results  Component Value Date   WBC 7.3 03/09/2014   HGB 14.5 03/09/2014   HCT 44.7 03/09/2014   PLT 313.0 03/09/2014   GLUCOSE 86 03/09/2014   CHOL 203* 03/09/2014   TRIG 103.0 03/09/2014   HDL 63.40 03/09/2014   LDLCALC 119* 03/09/2014   ALT 28 03/09/2014   AST 22 03/09/2014   NA 140 03/09/2014   K 5.0 03/09/2014   CL 104 03/09/2014   CREATININE 0.9 03/09/2014   BUN 13 03/09/2014   CO2 26 03/09/2014   TSH 1.08 03/09/2014   HGBA1C 6.2 03/09/2014       Assessment & Plan:

## 2014-05-10 NOTE — Patient Instructions (Signed)

## 2014-05-10 NOTE — Progress Notes (Signed)
Pre visit review using our clinic review tool, if applicable. No additional management support is needed unless otherwise documented below in the visit note. 

## 2014-05-11 ENCOUNTER — Encounter: Payer: Self-pay | Admitting: Internal Medicine

## 2014-05-11 ENCOUNTER — Encounter: Payer: Medicare Other | Attending: Surgery | Admitting: Dietician

## 2014-05-11 ENCOUNTER — Telehealth: Payer: Self-pay

## 2014-05-11 ENCOUNTER — Encounter: Payer: Self-pay | Admitting: Dietician

## 2014-05-11 VITALS — Ht 62.0 in | Wt 215.9 lb

## 2014-05-11 DIAGNOSIS — Z713 Dietary counseling and surveillance: Secondary | ICD-10-CM | POA: Insufficient documentation

## 2014-05-11 DIAGNOSIS — Z6839 Body mass index (BMI) 39.0-39.9, adult: Secondary | ICD-10-CM | POA: Diagnosis not present

## 2014-05-11 DIAGNOSIS — E669 Obesity, unspecified: Secondary | ICD-10-CM

## 2014-05-11 NOTE — Assessment & Plan Note (Addendum)
Her BP is well controlled edarbi was too expensive, will change to a generic Lytes and renal function are stable

## 2014-05-11 NOTE — Progress Notes (Signed)
  Pre-Op Assessment Visit:  Pre-Operative RYGB Surgery  Medical Nutrition Therapy:  Appt start time: 0800   End time:  0845.  Patient was seen on 05/11/2014 for Pre-Operative Nutrition Assessment. Assessment and letter of approval faxed to [facility] Bariatric Surgery Program coordinator on 05/11/2014.   Preferred Learning Style:   No preference indicated   Learning Readiness:   Ready   Samples provided and patient instructed on proper use: Bariatric Advantage Calcium citrate chew (caramel - qty 2) Lot#: [id number] Exp: 07/2014  Bariatric Advantage Calcium citrate chew (orange - qty 2) Lot#: [id number] Exp: 07/2014  Handouts given during visit include:  Pre-Op Goals Bariatric Surgery Protein Shakes   During the appointment today the following Pre-Op Goals were reviewed with the patient: Maintain or lose weight as instructed by your surgeon Make healthy food choices Begin to limit portion sizes Limited concentrated sugars and fried foods Keep fat/sugar in the single digits per serving on   food labels Practice CHEWING your food  (aim for 30 chews per bite or until applesauce consistency) Practice not drinking 15 minutes before, during, and 30 minutes after each meal/snack Avoid all carbonated beverages  Avoid/limit caffeinated beverages  Avoid all sugar-sweetened beverages Consume 3 meals per day; eat every 3-5 hours Make a list of non-food related activities Aim for 64-100 ounces of FLUID daily  Aim for at least 60-80 grams of PROTEIN daily Look for a liquid protein source that contain ?15 g protein and ?5 g carbohydrate  (ex: shakes, drinks, shots)  Patient-Centered Goals: -Overall health -Long life -Ability to travel  Scale of 1-10: confidence (8) / importance (10)  Demonstrated degree of understanding via:  Teach Back  Teaching Method Utilized:  Visual Auditory  Barriers to learning/adherence to lifestyle change: none  [facility] to [facility] to enroll in Pre-Op and Post-Op Nutrition Education when surgery date is scheduled.

## 2014-05-11 NOTE — Assessment & Plan Note (Signed)
DEXA scan reordered

## 2014-05-11 NOTE — Telephone Encounter (Signed)
Letter of med Delma Post and supervised weight loss documentation for 05/10/14 and 03/09/14 faxed back [phone] Bariatric coordinator.

## 2014-05-11 NOTE — Assessment & Plan Note (Signed)
The software blocked my order for a B12 level Will cont the supplement

## 2014-05-11 NOTE — Patient Instructions (Signed)

## 2014-05-11 NOTE — Assessment & Plan Note (Signed)
The software blocked my order for a Vit D level She will cont the supplement

## 2014-05-13 LAB — METHYLMALONIC ACID, SERUM: METHYLMALONIC ACID, QUANT: 153 nmol/L (ref 87–318)

## 2014-06-08 ENCOUNTER — Ambulatory Visit: Payer: Medicare Other | Admitting: *Deleted

## 2014-06-08 ENCOUNTER — Encounter: Payer: Self-pay | Admitting: *Deleted

## 2014-06-08 ENCOUNTER — Telehealth: Payer: Self-pay | Admitting: *Deleted

## 2014-06-08 VITALS — Ht 62.0 in | Wt 216.4 lb

## 2014-06-08 DIAGNOSIS — Z9884 Bariatric surgery status: Secondary | ICD-10-CM

## 2014-06-08 NOTE — Telephone Encounter (Signed)
LA - please help

## 2014-06-08 NOTE — Telephone Encounter (Signed)
Forms for 02/2014, 04/2014, and 05/2014 have now been completed. Forms require that patient has height and BP checked at weight checks as well.

## 2014-06-08 NOTE — Telephone Encounter (Signed)
What do I have to do??

## 2014-06-08 NOTE — Telephone Encounter (Signed)
Pt came in for her weight check she is wanting to know can md back date her weight starting in December. She is needing a total of 6 weight check prior to weight loss surgery. She is getting married in October and wanting to have surgery done before...Shawna Ellison

## 2014-06-08 NOTE — Telephone Encounter (Signed)
She stated that she gave you the form last month once she has a total of 6 weight checks you will have to complete the form. Today makes her 3rd weight check...Shawna Ellison

## 2014-06-09 ENCOUNTER — Telehealth: Payer: Self-pay

## 2014-06-09 NOTE — Telephone Encounter (Signed)
[facility] order for this pt. They state that medicare will not cover the diagnosis of vit d deficiency but will however cover estrogen deficiency. They are in need of a new order with updated dx. Thanks

## 2014-06-10 ENCOUNTER — Other Ambulatory Visit: Payer: Self-pay | Admitting: Internal Medicine

## 2014-06-10 DIAGNOSIS — E2839 Other primary ovarian failure: Secondary | ICD-10-CM | POA: Insufficient documentation

## 2014-06-10 DIAGNOSIS — M8580 Other specified disorders of bone density and structure, unspecified site: Secondary | ICD-10-CM

## 2014-06-10 NOTE — Telephone Encounter (Signed)
done

## 2014-06-30 ENCOUNTER — Encounter: Payer: Self-pay | Admitting: Internal Medicine

## 2014-07-04 DIAGNOSIS — Z780 Asymptomatic menopausal state: Secondary | ICD-10-CM | POA: Diagnosis not present

## 2014-07-04 LAB — HM DEXA SCAN: HM Dexa Scan: -1.5

## 2014-07-11 ENCOUNTER — Ambulatory Visit: Payer: Medicare Other | Admitting: *Deleted

## 2014-07-11 VITALS — Ht 62.0 in | Wt 218.1 lb

## 2014-07-11 DIAGNOSIS — Z9884 Bariatric surgery status: Secondary | ICD-10-CM

## 2014-07-11 NOTE — Addendum Note (Signed)
Addended by: Janith Lima on: 07/11/2014 11:26 AM   Modules accepted: Miquel Dunn

## 2014-07-18 ENCOUNTER — Ambulatory Visit: Payer: Medicare Other | Admitting: Psychiatry

## 2014-07-19 ENCOUNTER — Ambulatory Visit (INDEPENDENT_AMBULATORY_CARE_PROVIDER_SITE_OTHER): Payer: Medicare Other | Admitting: Psychiatry

## 2014-07-20 DIAGNOSIS — M170 Bilateral primary osteoarthritis of knee: Secondary | ICD-10-CM | POA: Diagnosis not present

## 2014-07-25 ENCOUNTER — Ambulatory Visit (INDEPENDENT_AMBULATORY_CARE_PROVIDER_SITE_OTHER): Payer: Medicare Other | Admitting: Psychiatry

## 2014-07-26 DIAGNOSIS — M170 Bilateral primary osteoarthritis of knee: Secondary | ICD-10-CM | POA: Diagnosis not present

## 2014-08-02 DIAGNOSIS — M170 Bilateral primary osteoarthritis of knee: Secondary | ICD-10-CM | POA: Diagnosis not present

## 2014-08-08 ENCOUNTER — Ambulatory Visit: Payer: Medicare Other | Admitting: *Deleted

## 2014-08-08 ENCOUNTER — Ambulatory Visit (INDEPENDENT_AMBULATORY_CARE_PROVIDER_SITE_OTHER): Payer: Medicare Other | Admitting: Psychiatry

## 2014-08-08 VITALS — Ht 62.0 in | Wt 217.0 lb

## 2014-08-08 DIAGNOSIS — Z9884 Bariatric surgery status: Secondary | ICD-10-CM

## 2014-09-11 ENCOUNTER — Encounter: Payer: Self-pay | Admitting: Internal Medicine

## 2014-09-11 ENCOUNTER — Ambulatory Visit (INDEPENDENT_AMBULATORY_CARE_PROVIDER_SITE_OTHER): Payer: Medicare Other | Admitting: Internal Medicine

## 2014-09-11 VITALS — BP 126/88 | HR 75 | Temp 97.6°F | Resp 16 | Ht 62.0 in | Wt 216.5 lb

## 2014-09-11 DIAGNOSIS — I10 Essential (primary) hypertension: Secondary | ICD-10-CM | POA: Diagnosis not present

## 2014-09-11 DIAGNOSIS — R739 Hyperglycemia, unspecified: Secondary | ICD-10-CM | POA: Diagnosis not present

## 2014-09-11 NOTE — Patient Instructions (Signed)

## 2014-09-11 NOTE — Progress Notes (Signed)
Pre visit review using our clinic review tool, if applicable. No additional management support is needed unless otherwise documented below in the visit note. 

## 2014-09-11 NOTE — Progress Notes (Signed)
Subjective:  Patient ID: Shawna Ellison, female    DOB: October 03, 1945  Age: 69 y.o. MRN: [id number]  CC: Hypertension   HPI Shawna Ellison presents for a BP check - she feels well and offers no complaints.  Outpatient Prescriptions Prior to Visit  Medication Sig Dispense Refill  . Cholecalciferol 2000 UNITS TABS Take 1 tablet (2,000 Units total) by mouth daily. 90 tablet 3  . cyanocobalamin 2000 MCG tablet Take 1 tablet (2,000 mcg total) by mouth daily. 90 tablet 3  . DULoxetine (CYMBALTA) 20 MG capsule Take 1 capsule (20 mg total) by mouth 2 (two) times daily. 180 capsule 3  . fluoruracil (FLUOROPLEX) 1 % cream Apply topically 2 (two) times daily.    . furosemide (LASIX) 20 MG tablet Take 20 mg by mouth as needed.    . thiamine (VITAMIN B-1) 100 MG tablet Take 1 tablet (100 mg total) by mouth daily. 90 tablet 3  . valsartan (DIOVAN) 160 MG tablet Take 1 tablet (160 mg total) by mouth daily. 90 tablet 3   No facility-administered medications prior to visit.    ROS Review of Systems  Constitutional: Negative.  Negative for fever, chills, diaphoresis, appetite change and fatigue.  HENT: Negative.   Eyes: Negative.   Respiratory: Negative.  Negative for cough, choking, chest tightness, shortness of breath and stridor.   Cardiovascular: Negative.  Negative for chest pain, palpitations and leg swelling.  Gastrointestinal: Negative.  Negative for nausea, vomiting, diarrhea, constipation and blood in stool.  Endocrine: Negative.   Genitourinary: Negative.  Negative for difficulty urinating.  Musculoskeletal: Negative.  Negative for myalgias, back pain, joint swelling and arthralgias.  Skin: Negative.  Negative for rash.  Allergic/Immunologic: Negative.   Neurological: Negative.  Negative for dizziness, tremors, syncope, light-headedness, numbness and headaches.  Hematological: Negative.  Negative for adenopathy. Does not bruise/bleed easily.  Psychiatric/Behavioral: Negative.      Objective:  BP 126/88 mmHg  Pulse 75  Temp(Src) 97.6 F (36.4 C) (Oral)  Ht 5\' 2"  (1.575 m)  Wt 216 lb 8 oz (98.204 kg)  BMI 39.59 kg/m2  SpO2 96%  BP Readings from Last 3 Encounters:  09/11/14 126/88  05/10/14 118/76  04/20/14 122/72    Wt Readings from Last 3 Encounters:  09/11/14 216 lb 8 oz (98.204 kg)  08/08/14 217 lb (98.431 kg)  07/11/14 218 lb 1.9 oz (98.939 kg)    Physical Exam  Constitutional: She is oriented to person, place, and time. She appears well-developed and well-nourished. No distress.  HENT:  Head: Normocephalic and atraumatic.  Mouth/Throat: Oropharynx is clear and moist. No oropharyngeal exudate.  Eyes: Conjunctivae are normal. Right eye exhibits no discharge. Left eye exhibits no discharge. No scleral icterus.  Neck: Normal range of motion. Neck supple. No JVD present. No tracheal deviation present. No thyromegaly present.  Cardiovascular: Normal rate, regular rhythm, normal heart sounds and intact distal pulses.  Exam reveals no gallop and no friction rub.   No murmur heard. Pulmonary/Chest: Effort normal and breath sounds normal. No stridor. No respiratory distress. She has no wheezes. She has no rales. She exhibits no tenderness.  Abdominal: Soft. Bowel sounds are normal. She exhibits no distension and no mass. There is no tenderness. There is no rebound and no guarding.  Musculoskeletal: Normal range of motion. She exhibits no edema or tenderness.  Lymphadenopathy:    She has no cervical adenopathy.  Neurological: She is oriented to person, place, and time.  Skin: Skin is warm and dry.  No rash noted. She is not diaphoretic. No erythema. No pallor.  Psychiatric: She has a normal mood and affect. Her behavior is normal. Judgment and thought content normal.    Lab Results  Component Value Date   WBC 7.3 03/09/2014   HGB 14.5 03/09/2014   HCT 44.7 03/09/2014   PLT 313.0 03/09/2014   GLUCOSE 104* 05/10/2014   CHOL 203* 03/09/2014   TRIG  103.0 03/09/2014   HDL 63.40 03/09/2014   LDLCALC 119* 03/09/2014   ALT 23 05/10/2014   AST 16 05/10/2014   NA 137 05/10/2014   K 5.0 05/10/2014   CL 101 05/10/2014   CREATININE 0.86 05/10/2014   BUN 17 05/10/2014   CO2 32 05/10/2014   TSH 1.08 03/09/2014   HGBA1C 6.2 03/09/2014    Dg Ugi  W/kub  04/26/2014   CLINICAL DATA:  Preop gastric bypass surgery.  Prior lap band  EXAM: UPPER GI SERIES WITH KUB  TECHNIQUE: After obtaining a scout radiograph a routine upper GI series was performed using thin barium  FLUOROSCOPY TIME:  Radiation Exposure Index (as provided by the fluoroscopic device):  If the device does not provide the exposure index:  Fluoroscopy Time (in minutes and seconds):  2 min 6 seconds  Number of Acquired Images:  2  COMPARISON:  Plain films 09/07/2007  FINDINGS: Scout film demonstrates gastric bypass device which remains in stable position and orientation.  Fluoroscopic evaluation of swallowing demonstrates normal esophageal peristalsis. Occasional tertiary contractions.  There is a small hiatal hernia. Spontaneous reflux noted during the procedure into the lower esophagus.  Contrast passes freely through the lap band and into the stomach. No evidence of fold thickening, ulceration or mass. Duodenal bulb and duodenal sweep are unremarkable.  IMPRESSION: Lap band device in stable position.  Small hiatal hernia. 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   Electronically Signed   By: Rolm Baptise M.D.   On: 04/26/2014 10:14    Assessment & Plan:   Shawna Ellison was seen today for hypertension.  Diagnoses and all orders for this visit:  Essential hypertension, benign - her BP is well controlled, will monitor her lytes and renal function Orders: -     Basic metabolic panel; Future  Hyperglycemia - will check her A1C to see if she has developed DM2 Orders: -     Basic metabolic panel; Future -     Hemoglobin A1c; Future   I am having Shawna Ellison maintain her  thiamine, furosemide, Cholecalciferol, cyanocobalamin, DULoxetine, fluoruracil, valsartan, and HYDROcodone-acetaminophen.  Meds ordered this encounter  Medications  . HYDROcodone-acetaminophen (NORCO/VICODIN) 5-325 MG per tablet    Sig:      Follow-up: No Follow-up on file.  Scarlette Calico, MD

## 2014-09-20 ENCOUNTER — Encounter: Payer: Self-pay | Admitting: Internal Medicine

## 2014-09-20 DIAGNOSIS — M1712 Unilateral primary osteoarthritis, left knee: Secondary | ICD-10-CM | POA: Diagnosis not present

## 2014-09-20 DIAGNOSIS — M1711 Unilateral primary osteoarthritis, right knee: Secondary | ICD-10-CM | POA: Diagnosis not present

## 2014-10-05 ENCOUNTER — Other Ambulatory Visit: Payer: Self-pay | Admitting: Surgery

## 2014-10-09 ENCOUNTER — Encounter: Payer: Medicare Other | Attending: Surgery

## 2014-10-09 VITALS — Ht 63.0 in | Wt 220.0 lb

## 2014-10-09 DIAGNOSIS — Z6839 Body mass index (BMI) 39.0-39.9, adult: Secondary | ICD-10-CM | POA: Diagnosis not present

## 2014-10-09 DIAGNOSIS — E669 Obesity, unspecified: Secondary | ICD-10-CM | POA: Insufficient documentation

## 2014-10-09 DIAGNOSIS — Z713 Dietary counseling and surveillance: Secondary | ICD-10-CM | POA: Insufficient documentation

## 2014-10-09 DIAGNOSIS — Z01818 Encounter for other preprocedural examination: Secondary | ICD-10-CM | POA: Diagnosis not present

## 2014-10-09 NOTE — Progress Notes (Signed)
  Pre-Operative Nutrition Class:  Appt start time: 830   End time:  930.  Patient was seen on 10/09/14 for Pre-Operative Bariatric Surgery Education [facility].   Surgery date:  Surgery type: RYGB [facility] weight [facility]: 216 lbs on 05/11/14 Weight today: 220 lbs  TANITA  BODY COMP RESULTS  10/09/14   BMI (kg/m^2) 39   Fat Mass (lbs) 117.5   Fat Free Mass (lbs) 102.5   Total Body Water (lbs) 75   Samples given per MNT protocol. Patient educated on appropriate usage: Premier protein shake (chocolate - qty 1) Lot #: [id number] Exp: 06/2015  Celebrate Vitamins Multivitamin (berry - qty 1) Lot #: [id number] Exp: 05/2016  Renee Pain Protein Powder (chicken soup - qty 1) Lot #: [id number] Exp: 09/2015    The following the learning objectives were met by the patient during this course:  Identify Pre-Op Dietary Goals and will begin 2 weeks pre-operatively  Identify appropriate sources of fluids and proteins   State protein recommendations and appropriate sources pre and post-operatively  Identify Post-Operative Dietary Goals and will follow for 2 weeks post-operatively  Identify appropriate multivitamin and calcium sources  Describe the need for physical activity post-operatively and will follow MD recommendations  State when to call healthcare provider regarding medication questions or post-operative complications  Handouts given during class include:  Pre-Op Bariatric Surgery Diet Handout  Protein Shake Handout  Post-Op Bariatric Surgery Nutrition Handout  [facility] Program Information Flyer  Support Group Information Flyer  [facility] Bariatric Supplements Price List  Follow-Up Plan: Patient will follow-up at [facility] 2 weeks post operatively for diet advancement per MD.

## 2014-10-11 ENCOUNTER — Other Ambulatory Visit: Payer: Self-pay | Admitting: Orthopedic Surgery

## 2014-10-26 DIAGNOSIS — Z9884 Bariatric surgery status: Secondary | ICD-10-CM | POA: Diagnosis not present

## 2014-11-07 ENCOUNTER — Ambulatory Visit: Payer: Self-pay

## 2014-11-08 ENCOUNTER — Other Ambulatory Visit (HOSPITAL_COMMUNITY): Payer: Self-pay | Admitting: *Deleted

## 2014-11-08 NOTE — Patient Instructions (Addendum)
Shawna Ellison  11/08/2014   Your procedure is scheduled on: Monday August 22th, 2016  Report to [facility] Main  Entrance take [place]  elevators to 3rd floor to  [place] at  515 AM.  Call this number if you have problems the morning of surgery [phone]   Remember: ONLY 1 PERSON MAY GO WITH YOU TO SHORT STAY TO GET  READY MORNING OF [place].  Do not eat food or drink liquids :After Midnight. Follow any bowel prep instructions from dr Lucia Gaskins     Take these medicines the morning of surgery with A SIP OF WATER: Duloxetine ( Cymbalta)                               You may not have any metal on your body including hair pins and              piercings  Do not wear jewelry, make-up, lotions, powders or perfumes, deodorant             Do not wear nail polish.  Do not shave  48 hours prior to surgery.              Men may shave face and neck.   Do not bring valuables to the hospital. [place].  Contacts, dentures or bridgework may not be worn into surgery.  Leave suitcase in the car. After surgery it may be brought to your room.     Patients discharged the day of surgery will not be allowed to drive home.  Name and phone number of your driver:  Special Instructions: N/A              Please read over the following fact sheets you were given: _____________________________________________________________________             [facility] - Preparing for Surgery Before surgery, you can play an important role.  Because skin is not sterile, your skin needs to be as free of germs as possible.  You can reduce the number of germs on your skin by washing with CHG (chlorahexidine gluconate) soap before surgery.  CHG is an antiseptic cleaner which kills germs and bonds with the skin to continue killing germs even after washing. Please DO NOT use if you have an allergy to CHG or antibacterial soaps.  If your skin  becomes reddened/irritated stop using the CHG and inform your nurse when you arrive at Short Stay. Do not shave (including legs and underarms) for at least 48 hours prior to the first CHG shower.  You may shave your face/neck. Please follow these instructions carefully:  1.  Shower with CHG Soap the night before surgery and the  morning of Surgery.  2.  If you choose to wash your hair, wash your hair first as usual with your  normal  shampoo.  3.  After you shampoo, rinse your hair and body thoroughly to remove the  shampoo.                           4.  Use CHG as you would any other liquid soap.  You can apply chg directly  to the skin and wash  Gently with a scrungie or clean washcloth.  5.  Apply the CHG Soap to your body ONLY FROM THE NECK DOWN.   Do not use on face/ open                           Wound or open sores. Avoid contact with eyes, ears mouth and genitals (private parts).                       Wash face,  Genitals (private parts) with your normal soap.             6.  Wash thoroughly, paying special attention to the area where your surgery  will be performed.  7.  Thoroughly rinse your body with warm water from the neck down.  8.  DO NOT shower/wash with your normal soap after using and rinsing off  the CHG Soap.                9.  Pat yourself dry with a clean towel.            10.  Wear clean pajamas.            11.  Place clean sheets on your bed the night of your first shower and do not  sleep with pets. Day of Surgery : Do not apply any lotions/deodorants the morning of surgery.  Please wear clean clothes to the hospital/surgery center.  FAILURE TO FOLLOW THESE INSTRUCTIONS MAY RESULT IN THE CANCELLATION OF YOUR SURGERY PATIENT SIGNATURE_________________________________  NURSE SIGNATURE__________________________________  ________________________________________________________________________

## 2014-11-09 ENCOUNTER — Encounter (HOSPITAL_COMMUNITY)
Admission: RE | Admit: 2014-11-09 | Discharge: 2014-11-09 | Disposition: A | Discharge status: Home / Self Care | Payer: Medicare Other | Source: Ambulatory Visit | Attending: Surgery | Admitting: Surgery

## 2014-11-09 ENCOUNTER — Encounter (HOSPITAL_COMMUNITY): Payer: Self-pay

## 2014-11-09 DIAGNOSIS — Z01818 Encounter for other preprocedural examination: Secondary | ICD-10-CM | POA: Insufficient documentation

## 2014-11-09 HISTORY — DX: Adverse effect of unspecified anesthetic, initial encounter: T41.45XA

## 2014-11-09 HISTORY — DX: Other complications of anesthesia, initial encounter: T88.59XA

## 2014-11-09 LAB — COMPREHENSIVE METABOLIC PANEL
ALBUMIN: 3.9 g/dL (ref 3.5–5.0)
ALT: 27 U/L (ref 14–54)
AST: 24 U/L (ref 15–41)
Alkaline Phosphatase: 66 U/L (ref 38–126)
Anion gap: 7 (ref 5–15)
BUN: 14 mg/dL (ref 6–20)
CHLORIDE: 103 mmol/L (ref 101–111)
CO2: 29 mmol/L (ref 22–32)
Calcium: 9 mg/dL (ref 8.9–10.3)
Creatinine, Ser: 0.72 mg/dL (ref 0.44–1.00)
GFR calc Af Amer: >60 mL/min (ref >60)
GFR calc non Af Amer: >60 mL/min (ref >60)
GLUCOSE: 98 mg/dL (ref 65–99)
POTASSIUM: 3.8 mmol/L (ref 3.5–5.1)
Sodium: 139 mmol/L (ref 135–145)
Total Bilirubin: 0.5 mg/dL (ref 0.3–1.2)
Total Protein: 7.2 g/dL (ref 6.5–8.1)

## 2014-11-09 LAB — CBC WITH DIFFERENTIAL/PLATELET
BASOS ABS: 0 10*3/uL (ref 0.0–0.1)
BASOS PCT: 0 % (ref 0–1)
EOS ABS: 0.1 10*3/uL (ref 0.0–0.7)
EOS PCT: 2 % (ref 0–5)
HCT: 42 % (ref 36.0–46.0)
Hemoglobin: 13.6 g/dL (ref 12.0–15.0)
Lymphocytes Relative: 43 % (ref 12–46)
Lymphs Abs: 2 10*3/uL (ref 0.7–4.0)
MCH: 30.6 pg (ref 26.0–34.0)
MCHC: 32.4 g/dL (ref 30.0–36.0)
MCV: 94.4 fL (ref 78.0–100.0)
MONO ABS: 0.4 10*3/uL (ref 0.1–1.0)
Monocytes Relative: 8 % (ref 3–12)
Neutro Abs: 2.2 10*3/uL (ref 1.7–7.7)
Neutrophils Relative %: 47 % (ref 43–77)
PLATELETS: 265 10*3/uL (ref 150–400)
RBC: 4.45 MIL/uL (ref 3.87–5.11)
RDW: 13.1 % (ref 11.5–15.5)
WBC: 4.8 10*3/uL (ref 4.0–10.5)

## 2014-11-12 NOTE — H&P (Signed)
Shawna Ellison 10/26/2014 8:32 AM Location: [facility] Patient #: [id number] DOB: 12-28-1945 Divorced / Language: Shawna Ellison / Race: White Female  History of Present Illness   Patient words: preop bari.   The patient is a 69 year old female is here for lapband followup.   Her PCP is Shawna Ellison.  She is accompanied with her boyfriend, Shawna Ellison (I mistook him before for a husband)   She is scheduled for revision from lap band to RYGB on 11/13/2014.  She appears to be scheduled for right knee replacement - 12/08/2014 with Shawna Ellison. I recommended that she wait for at least 3 months after surgery before doing this.  She has gone to another information session. Her boyfried went with her. She thinks that she understands the surgery well.   She is planning on marrying her boyfriend, Shawna Ellison, in September in [place].   Per the [facility], the patient is a candidate for bariatric surgery. The patient attended an information session and reviewed the types of bariatric surgery.   The patient is interested in the Roux en Y Gastric Bypass. I discussed with the patient the indications and risks of bariatric surgery. Since this is revisional surgery, the risks are inherently higher and she understands this. There is a chance that I could remove the lap band and stop there. The potential risks of surgery include, but are not limited to, bleeding, infection, leak from the bowel, DVT and PE, open surgery, long term nutrition consequences, and death. The patient understands the importance of compliance and long term follow-up with our group after surgery.  UGI - 04/26/2014 - Normal lap band position. Small Hiatal hernia with spontaneous reflux. Psych - 08/08/2014 - letter from Shawna Ellison - 10/09/2014 - saw Shawna Ellison  History of Lap Band: She had an APS lap band by Shawna Ellison on 09/06/2007. Her initial weight was 223 with a  BMI of 39.6. Her lowest weight was 185 in 02/2008. The patient gives a story that she originally wanted a gastric bypass. She said that Shawna Ellison talked her into a lap band. She did well from October 2009 to May 2010 with her weight in the 180s. However, because of social issues her weight has gradually climbed back up. One of her goals was to get her weight below 170. She quotes Shawna Ellison that she would never get there. Shawna Ellison has a friend who had a gastric bypass who successfully lost more than 100 pounds. She has also been under stress recently - her mother died in 14-Nov-2013. So she is still settling her estate. Much of this is better on this visit.  Past Medical History: 1. She has HTN - just started meds Dec 2015 2. Sees Shawna Ellison of [facility] 3. Sees Shawna Ellison for knees  She had a cyst of her right knee meniscus removed feb 2014  She now has had cortisone shots to both knees  She is trying a new injeciton in both knees - but she thinks that she is going to knee replacement- ** She is scheduled to have a right knee replacement in Sept 2016 - I recommended that she wait for at least 3 months after surgery before doing this. 4. She had a colonoscopy by Shawna Ellison in Feb 2015 5. Incontinence - She saw Shawna Ellison once  Social History: Boyfriend, Shawna Ellison - they are going to get married in September, 2016 Has 2 children - 66 yo  son and 79 yo daughter She works part time with Shawna Ellison - as transport   Allergies (Shawna Eversole, LPN; 10/22/8297 3:71 AM) No Known Drug Allergies01/21/2016  Medication History (Shawna Eversole, LPN; 08/31/6787 3:81 AM) Shawna Ellison (80MG  Tablet, Oral) Active. Cholecalciferol (2000UNIT Capsule, Oral) Active. Cyanocobalamin (2000MCG Tablet, Oral) Active. Cymbalta (20MG  Capsule DR Part, Oral) Active. Lasix (20MG  Tablet, Oral) Active. Vitamin B-1 (100MG  Tablet, Oral)  Active. Medications Reconciled  Review of Systems Shawna Ellison; 10/26/2014 8:35 AM) General Present- Weight Gain. Not Present- Appetite Loss, Chills, Fatigue, Fever, Night Sweats and Weight Loss. Skin Not Present- Change in Wart/Mole, Dryness, Hives, Jaundice, New Lesions, Non-Healing Wounds, Rash and Ulcer. HEENT Not Present- Earache, Hearing Loss, Hoarseness, Nose Bleed, Oral Ulcers, Ringing in the Ears, Seasonal Allergies, Sinus Pain, Sore Throat, Visual Disturbances, Wears glasses/contact lenses and Yellow Eyes. Respiratory Not Present- Bloody sputum, Chronic Cough, Difficulty Breathing, Snoring and Wheezing. Breast Not Present- Breast Mass, Breast Pain, Nipple Discharge and Skin Changes. Cardiovascular Not Present- Chest Pain, Difficulty Breathing Lying Down, Leg Cramps, Palpitations, Rapid Heart Rate, Shortness of Breath and Swelling of Extremities. Gastrointestinal Not Present- Abdominal Pain, Bloating, Bloody Stool, Change in Bowel Habits, Chronic diarrhea, Constipation, Difficulty Swallowing, Excessive gas, Gets full quickly at meals, Hemorrhoids, Indigestion, Nausea, Rectal Pain and Vomiting. Female Genitourinary Present- Urgency. Not Present- Frequency, Nocturia, Painful Urination and Pelvic Pain. Musculoskeletal Present- Joint Pain and Joint Stiffness. Not Present- Back Pain, Muscle Pain, Muscle Weakness and Swelling of Extremities. Neurological Not Present- Decreased Memory, Fainting, Headaches, Numbness, Seizures, Tingling, Tremor, Trouble walking and Weakness. Psychiatric Not Present- Anxiety, Bipolar, Change in Sleep Pattern, Depression, Fearful and Frequent crying. Endocrine Not Present- Cold Intolerance, Excessive Hunger, Hair Changes, Heat Intolerance, Hot flashes and New Diabetes. Hematology Not Present- Easy Bruising, Excessive bleeding, Gland problems, HIV and Persistent Infections.   Vitals (Shawna Eversole LPN; 0/03/7508 2:58 AM) 10/26/2014 8:33 AM Weight: 220.2 lb  Height: 63in Body Surface Area: 2.11 m Body Mass Index: 39.01 kg/m Temp.: 97.45F(Oral)  Pulse: 78 (Regular)  BP: 124/82 (Sitting, Left Arm, Standard)   Physical Exam  General: Older looking WF alert and generally healthy appearing. HEENT: Normal. Pupils equal. Good dentition.  Neck: Supple. No mass. No thyroid mass.  Lymph Nodes: No supraclavicular or cervical nodes.  Lungs: Clear to auscultation and symmetric breath sounds. Heart: RRR. No murmur or rub.  Abdomen: Soft. No mass. No tenderness. No hernia. Normal bowel sounds. She is more apple than pear. Her lap band port is palpable in the RUQ.  Extremities: Good strength and ROM in upper and lower extremities.  Neurologic: Grossly intact to motor and sensory function. Psychiatric: Has normal mood and affect. Behavior is normal.  Assessment & Plan  1.  HISTORY OF LAPAROSCOPIC ADJUSTABLE GASTRIC BANDING (V45.86  Z98.84)  Story: She had an APS lap band by Shawna Ellison on 6/15/209.  Impression: Scheduled for revision to RYGB on 11/13/2014. I think that she understands the risks and benefits of surgery.   She has her bowel prep.   She is starting her pre op diet.   She has her prescription for Oxycodone.  2.  MORBID OBESITY DUE TO EXCESS CALORIES (278.01  E66.01)  Impression: Her weight is 216 and her BMI is 38.26  3.  HTN - just started meds Dec 2015 4. Sees Shawna Ellison for knees  ** She is scheduled to have a right knee replacement in Sept 2016 - I recommended that she wait for at least 3 months after  surgery before doing this. 6. Incontinence - She saw Shawna Ellison once  Alphonsa Overall, Ellison, [facility] Pager: [phone] Office phone:  [phone]

## 2014-11-13 ENCOUNTER — Inpatient Hospital Stay (HOSPITAL_COMMUNITY)
Admission: RE | Admit: 2014-11-13 | Discharge: 2014-11-15 | DRG: 621 | Disposition: A | Discharge status: Home / Self Care | Payer: Medicare Other | Source: Ambulatory Visit | Attending: Surgery | Admitting: Surgery

## 2014-11-13 ENCOUNTER — Inpatient Hospital Stay (HOSPITAL_COMMUNITY): Payer: Medicare Other | Admitting: Anesthesiology

## 2014-11-13 ENCOUNTER — Encounter (HOSPITAL_COMMUNITY): Payer: Self-pay | Admitting: *Deleted

## 2014-11-13 ENCOUNTER — Encounter (HOSPITAL_COMMUNITY)
Admission: RE | Disposition: A | Discharge status: Home / Self Care | Payer: Self-pay | Source: Ambulatory Visit | Attending: Surgery

## 2014-11-13 DIAGNOSIS — R32 Unspecified urinary incontinence: Secondary | ICD-10-CM | POA: Diagnosis not present

## 2014-11-13 DIAGNOSIS — Z01812 Encounter for preprocedural laboratory examination: Secondary | ICD-10-CM

## 2014-11-13 DIAGNOSIS — Z01810 Encounter for preprocedural cardiovascular examination: Secondary | ICD-10-CM | POA: Diagnosis not present

## 2014-11-13 DIAGNOSIS — Z9884 Bariatric surgery status: Secondary | ICD-10-CM

## 2014-11-13 DIAGNOSIS — K449 Diaphragmatic hernia without obstruction or gangrene: Secondary | ICD-10-CM | POA: Diagnosis present

## 2014-11-13 DIAGNOSIS — Z48815 Encounter for surgical aftercare following surgery on the digestive system: Secondary | ICD-10-CM | POA: Diagnosis not present

## 2014-11-13 DIAGNOSIS — I10 Essential (primary) hypertension: Secondary | ICD-10-CM | POA: Diagnosis not present

## 2014-11-13 DIAGNOSIS — Z6839 Body mass index (BMI) 39.0-39.9, adult: Secondary | ICD-10-CM | POA: Diagnosis not present

## 2014-11-13 DIAGNOSIS — Z9889 Other specified postprocedural states: Secondary | ICD-10-CM | POA: Diagnosis not present

## 2014-11-13 DIAGNOSIS — Z79899 Other long term (current) drug therapy: Secondary | ICD-10-CM

## 2014-11-13 HISTORY — PX: GASTRIC ROUX-EN-Y: SHX5262

## 2014-11-13 LAB — HEMOGLOBIN AND HEMATOCRIT, BLOOD
HCT: 39.6 % (ref 36.0–46.0)
Hemoglobin: 12.3 g/dL (ref 12.0–15.0)

## 2014-11-13 SURGERY — REMOVAL, GASTRIC BAND, LAPAROSCOPIC
Anesthesia: General

## 2014-11-13 MED ORDER — PROPOFOL 10 MG/ML IV BOLUS
INTRAVENOUS | Status: DC | PRN
  Administered 2014-11-13: 30 mg via INTRAVENOUS
  Administered 2014-11-13: 170 mg via INTRAVENOUS

## 2014-11-13 MED ORDER — LIDOCAINE HCL (CARDIAC) 20 MG/ML IV SOLN
INTRAVENOUS | Status: DC | PRN
  Administered 2014-11-13: 60 mg via INTRAVENOUS

## 2014-11-13 MED ORDER — NEOSTIGMINE METHYLSULFATE 10 MG/10ML IV SOLN
INTRAVENOUS | Status: DC | PRN
  Administered 2014-11-13: 5 mg via INTRAVENOUS

## 2014-11-13 MED ORDER — LACTATED RINGERS IR SOLN
Status: DC | PRN
  Administered 2014-11-13: 3000 mL

## 2014-11-13 MED ORDER — FENTANYL CITRATE (PF) 250 MCG/5ML IJ SOLN
INTRAMUSCULAR | Status: AC
  Filled 2014-11-13: qty 25

## 2014-11-13 MED ORDER — OXYCODONE HCL 5 MG/5ML PO SOLN
5.0000 mg | ORAL | Status: DC | PRN
  Administered 2014-11-15 (×2): 5 mg via ORAL
  Filled 2014-11-13 (×2): qty 5

## 2014-11-13 MED ORDER — ROCURONIUM BROMIDE 100 MG/10ML IV SOLN
INTRAVENOUS | Status: AC
  Filled 2014-11-13: qty 1

## 2014-11-13 MED ORDER — ONDANSETRON HCL 4 MG/2ML IJ SOLN
INTRAMUSCULAR | Status: DC | PRN
  Administered 2014-11-13: 4 mg via INTRAVENOUS

## 2014-11-13 MED ORDER — METOCLOPRAMIDE HCL 5 MG/ML IJ SOLN
INTRAMUSCULAR | Status: AC
  Filled 2014-11-13: qty 2

## 2014-11-13 MED ORDER — METOCLOPRAMIDE HCL 5 MG/ML IJ SOLN
INTRAMUSCULAR | Status: DC | PRN
Start: 2014-11-13 — End: 2014-11-13
  Administered 2014-11-13: 10 mg via INTRAVENOUS

## 2014-11-13 MED ORDER — HYDROMORPHONE HCL 1 MG/ML IJ SOLN
INTRAMUSCULAR | Status: DC | PRN
  Administered 2014-11-13 (×3): 0.5 mg via INTRAVENOUS

## 2014-11-13 MED ORDER — BUPIVACAINE HCL (PF) 0.25 % IJ SOLN
INTRAMUSCULAR | Status: DC | PRN
  Administered 2014-11-13: 30 mL

## 2014-11-13 MED ORDER — DEXAMETHASONE SODIUM PHOSPHATE 10 MG/ML IJ SOLN
INTRAMUSCULAR | Status: DC | PRN
  Administered 2014-11-13: 10 mg via INTRAVENOUS

## 2014-11-13 MED ORDER — GLYCOPYRROLATE 0.2 MG/ML IJ SOLN
INTRAMUSCULAR | Status: AC
  Filled 2014-11-13: qty 1

## 2014-11-13 MED ORDER — HEPARIN SODIUM (PORCINE) 5000 UNIT/ML IJ SOLN
5000.0000 [IU] | Freq: Once | INTRAMUSCULAR | Status: AC
  Administered 2014-11-13: 5000 [IU] via SUBCUTANEOUS
  Filled 2014-11-13: qty 1

## 2014-11-13 MED ORDER — HEPARIN SODIUM (PORCINE) 5000 UNIT/ML IJ SOLN
5000.0000 [IU] | Freq: Three times a day (TID) | INTRAMUSCULAR | Status: DC
  Administered 2014-11-13 – 2014-11-15 (×5): 5000 [IU] via SUBCUTANEOUS
  Filled 2014-11-13 (×8): qty 1

## 2014-11-13 MED ORDER — FENTANYL CITRATE (PF) 100 MCG/2ML IJ SOLN
INTRAMUSCULAR | Status: DC | PRN
  Administered 2014-11-13 (×10): 50 ug via INTRAVENOUS
  Administered 2014-11-13: 100 ug via INTRAVENOUS

## 2014-11-13 MED ORDER — SODIUM CHLORIDE 0.9 % IJ SOLN
INTRAMUSCULAR | Status: AC
  Filled 2014-11-13: qty 10

## 2014-11-13 MED ORDER — ACETAMINOPHEN 160 MG/5ML PO SOLN
650.0000 mg | ORAL | Status: DC | PRN
  Administered 2014-11-14: 650 mg via ORAL
  Filled 2014-11-13: qty 20.3

## 2014-11-13 MED ORDER — MIDAZOLAM HCL 5 MG/5ML IJ SOLN
INTRAMUSCULAR | Status: DC | PRN
  Administered 2014-11-13: 2 mg via INTRAVENOUS

## 2014-11-13 MED ORDER — ONDANSETRON HCL 4 MG/2ML IJ SOLN
INTRAMUSCULAR | Status: AC
  Filled 2014-11-13: qty 2

## 2014-11-13 MED ORDER — GLYCOPYRROLATE 0.2 MG/ML IJ SOLN
INTRAMUSCULAR | Status: AC
  Filled 2014-11-13: qty 3

## 2014-11-13 MED ORDER — DEXTROSE 5 % IV SOLN
2.0000 g | INTRAVENOUS | Status: AC
  Administered 2014-11-13 (×2): 2 g via INTRAVENOUS

## 2014-11-13 MED ORDER — HYDROMORPHONE HCL 1 MG/ML IJ SOLN
0.2500 mg | INTRAMUSCULAR | Status: DC | PRN
  Administered 2014-11-13: 0.5 mg via INTRAVENOUS

## 2014-11-13 MED ORDER — ROCURONIUM BROMIDE 100 MG/10ML IV SOLN
INTRAVENOUS | Status: DC | PRN
  Administered 2014-11-13 (×2): 15 mg via INTRAVENOUS
  Administered 2014-11-13: 50 mg via INTRAVENOUS
  Administered 2014-11-13: 20 mg via INTRAVENOUS
  Administered 2014-11-13: 5 mg via INTRAVENOUS

## 2014-11-13 MED ORDER — DEXTROSE 5 % IV SOLN
INTRAVENOUS | Status: AC
  Filled 2014-11-13: qty 2

## 2014-11-13 MED ORDER — PHENYLEPHRINE 40 MCG/ML (10ML) SYRINGE FOR IV PUSH (FOR BLOOD PRESSURE SUPPORT)
PREFILLED_SYRINGE | INTRAVENOUS | Status: AC
  Filled 2014-11-13: qty 10

## 2014-11-13 MED ORDER — UNJURY VANILLA POWDER: 2.0000 [oz_av] | Freq: Four times a day (QID) | ORAL | Status: DC

## 2014-11-13 MED ORDER — OXYCODONE HCL 5 MG/5ML PO SOLN
5.0000 mg | Freq: Once | ORAL | Status: DC | PRN
  Filled 2014-11-13: qty 5

## 2014-11-13 MED ORDER — TISSEEL VH 10 ML EX KIT
PACK | CUTANEOUS | Status: DC | PRN
  Administered 2014-11-13: 20 mL

## 2014-11-13 MED ORDER — ONDANSETRON HCL 4 MG/2ML IJ SOLN
4.0000 mg | INTRAMUSCULAR | Status: DC | PRN
  Administered 2014-11-14: 4 mg via INTRAVENOUS
  Filled 2014-11-13: qty 2

## 2014-11-13 MED ORDER — OXYCODONE HCL 5 MG PO TABS: 5.0000 mg | ORAL_TABLET | Freq: Once | ORAL | Status: DC | PRN

## 2014-11-13 MED ORDER — UNJURY CHICKEN SOUP POWDER
2.0000 [oz_av] | Freq: Four times a day (QID) | ORAL | Status: DC
Start: 2014-11-15 — End: 2014-11-15
  Administered 2014-11-15: 2 [oz_av] via ORAL

## 2014-11-13 MED ORDER — LACTATED RINGERS IV SOLN
INTRAVENOUS | Status: DC | PRN
  Administered 2014-11-13 (×3): via INTRAVENOUS

## 2014-11-13 MED ORDER — EPHEDRINE SULFATE 50 MG/ML IJ SOLN
INTRAMUSCULAR | Status: AC
  Filled 2014-11-13: qty 1

## 2014-11-13 MED ORDER — TISSEEL VH 10 ML EX KIT
PACK | CUTANEOUS | Status: AC
  Filled 2014-11-13: qty 2

## 2014-11-13 MED ORDER — DEXAMETHASONE SODIUM PHOSPHATE 10 MG/ML IJ SOLN
INTRAMUSCULAR | Status: AC
  Filled 2014-11-13: qty 1

## 2014-11-13 MED ORDER — ONDANSETRON HCL 4 MG/2ML IJ SOLN: 4.0000 mg | Freq: Four times a day (QID) | INTRAMUSCULAR | Status: DC | PRN

## 2014-11-13 MED ORDER — HYDROMORPHONE HCL 2 MG/ML IJ SOLN
INTRAMUSCULAR | Status: AC
  Filled 2014-11-13: qty 1

## 2014-11-13 MED ORDER — KCL IN DEXTROSE-NACL 20-5-0.45 MEQ/L-%-% IV SOLN
INTRAVENOUS | Status: DC
  Administered 2014-11-13: 16:00:00 via INTRAVENOUS
  Administered 2014-11-13: 1000 mL via INTRAVENOUS
  Administered 2014-11-14 (×2): via INTRAVENOUS
  Administered 2014-11-14: 1000 mL via INTRAVENOUS
  Administered 2014-11-14 – 2014-11-15 (×2): via INTRAVENOUS
  Filled 2014-11-13 (×9): qty 1000

## 2014-11-13 MED ORDER — ACETAMINOPHEN 160 MG/5ML PO SOLN: 325.0000 mg | ORAL | Status: DC | PRN

## 2014-11-13 MED ORDER — NEOSTIGMINE METHYLSULFATE 10 MG/10ML IV SOLN
INTRAVENOUS | Status: AC
  Filled 2014-11-13: qty 1

## 2014-11-13 MED ORDER — EPHEDRINE SULFATE 50 MG/ML IJ SOLN
INTRAMUSCULAR | Status: DC | PRN
  Administered 2014-11-13 (×3): 5 mg via INTRAVENOUS

## 2014-11-13 MED ORDER — UNJURY CHOCOLATE CLASSIC POWDER
2.0000 [oz_av] | Freq: Four times a day (QID) | ORAL | Status: DC
  Administered 2014-11-15: 2 [oz_av] via ORAL

## 2014-11-13 MED ORDER — BUPIVACAINE HCL (PF) 0.25 % IJ SOLN
INTRAMUSCULAR | Status: AC
  Filled 2014-11-13: qty 60

## 2014-11-13 MED ORDER — LIDOCAINE HCL (CARDIAC) 20 MG/ML IV SOLN
INTRAVENOUS | Status: AC
  Filled 2014-11-13: qty 5

## 2014-11-13 MED ORDER — HYDROMORPHONE HCL 1 MG/ML IJ SOLN
INTRAMUSCULAR | Status: AC
  Filled 2014-11-13: qty 1

## 2014-11-13 MED ORDER — MIDAZOLAM HCL 2 MG/2ML IJ SOLN
INTRAMUSCULAR | Status: AC
  Filled 2014-11-13: qty 4

## 2014-11-13 MED ORDER — PHENYLEPHRINE HCL 10 MG/ML IJ SOLN
INTRAMUSCULAR | Status: DC | PRN
  Administered 2014-11-13 (×2): 80 ug via INTRAVENOUS
  Administered 2014-11-13 (×3): 40 ug via INTRAVENOUS
  Administered 2014-11-13: 80 ug via INTRAVENOUS

## 2014-11-13 MED ORDER — MORPHINE SULFATE (PF) 2 MG/ML IV SOLN
2.0000 mg | INTRAVENOUS | Status: DC | PRN
  Administered 2014-11-13 (×2): 2 mg via INTRAVENOUS
  Administered 2014-11-14: 4 mg via INTRAVENOUS
  Administered 2014-11-14 (×2): 2 mg via INTRAVENOUS
  Filled 2014-11-13 (×4): qty 1
  Filled 2014-11-13: qty 2

## 2014-11-13 MED ORDER — PROPOFOL 10 MG/ML IV BOLUS
INTRAVENOUS | Status: AC
  Filled 2014-11-13: qty 20

## 2014-11-13 MED ORDER — GLYCOPYRROLATE 0.2 MG/ML IJ SOLN
INTRAMUSCULAR | Status: DC | PRN
  Administered 2014-11-13: .7 mg via INTRAVENOUS

## 2014-11-13 SURGICAL SUPPLY — 73 items
APPLIER CLIP ROT 10 11.4 M/L (STAPLE)
APPLIER CLIP ROT 13.4 12 LRG (CLIP)
BLADE SURG 15 STRL LF DISP TIS (BLADE) ×1 IMPLANT
BLADE SURG 15 STRL SS (BLADE) ×2
CABLE HIGH FREQUENCY MONO STRZ (ELECTRODE) ×3 IMPLANT
CHLORAPREP W/TINT 26ML (MISCELLANEOUS) ×6 IMPLANT
CLIP APPLIE ROT 10 11.4 M/L (STAPLE) IMPLANT
CLIP APPLIE ROT 13.4 12 LRG (CLIP) IMPLANT
CLIP SUT LAPRA TY ABSORB (SUTURE) ×6 IMPLANT
COVER SURGICAL LIGHT HANDLE (MISCELLANEOUS) ×3 IMPLANT
DECANTER SPIKE VIAL GLASS SM (MISCELLANEOUS) IMPLANT
DEVICE SUT QUICK LOAD TK 5 (STAPLE) IMPLANT
DEVICE SUT TI-KNOT TK 5X26 (MISCELLANEOUS) IMPLANT
DEVICE SUTURE ENDOST 10MM (ENDOMECHANICALS) ×6 IMPLANT
DEVICE TI KNOT TK5 (MISCELLANEOUS)
DISSECTOR BLUNT TIP ENDO 5MM (MISCELLANEOUS) ×3 IMPLANT
DRAIN PENROSE 18X1/4 LTX STRL (WOUND CARE) ×3 IMPLANT
DRAPE CAMERA CLOSED 9X96 (DRAPES) ×3 IMPLANT
ELECT PENCIL ROCKER SW 15FT (MISCELLANEOUS) ×3 IMPLANT
GAUZE SPONGE 4X4 16PLY XRAY LF (GAUZE/BANDAGES/DRESSINGS) ×3 IMPLANT
GLOVE SURG SIGNA 7.5 PF LTX (GLOVE) ×18 IMPLANT
GOWN STRL REUS W/TWL XL LVL3 (GOWN DISPOSABLE) ×12 IMPLANT
HOVERMATT SINGLE USE (MISCELLANEOUS) ×3 IMPLANT
KIT BASIN OR (CUSTOM PROCEDURE TRAY) ×3 IMPLANT
KIT GASTRIC LAVAGE 34FR ADT (SET/KITS/TRAYS/PACK) ×3 IMPLANT
LIQUID BAND (GAUZE/BANDAGES/DRESSINGS) ×6 IMPLANT
LUBRICANT JELLY K Y 4OZ (MISCELLANEOUS) ×3 IMPLANT
NEEDLE SPNL 22GX3.5 QUINCKE BK (NEEDLE) ×3 IMPLANT
PACK CARDIOVASCULAR III (CUSTOM PROCEDURE TRAY) ×3 IMPLANT
PEN SKIN MARKING BROAD (MISCELLANEOUS) ×3 IMPLANT
QUICK LOAD TK 5 (STAPLE)
RELOAD 45 VASCULAR/THIN (ENDOMECHANICALS) ×6 IMPLANT
RELOAD ENDO STITCH 2.0 (ENDOMECHANICALS) ×24
RELOAD STAPLE TA45 3.5 REG BLU (ENDOMECHANICALS) ×6 IMPLANT
RELOAD STAPLER BLUE 60MM (STAPLE) ×1 IMPLANT
RELOAD STAPLER GOLD 60MM (STAPLE) ×5 IMPLANT
RELOAD STAPLER WHITE 60MM (STAPLE) IMPLANT
SCISSORS LAP 5X35 DISP (ENDOMECHANICALS) ×3 IMPLANT
SEALANT SURGICAL APPL DUAL CAN (MISCELLANEOUS) ×9 IMPLANT
SET IRRIG TUBING LAPAROSCOPIC (IRRIGATION / IRRIGATOR) ×3 IMPLANT
SHEARS HARMONIC ACE PLUS 36CM (ENDOMECHANICALS) ×3 IMPLANT
SLEEVE ADV FIXATION 12X100MM (TROCAR) IMPLANT
SLEEVE XCEL OPT CAN 5 100 (ENDOMECHANICALS) ×3 IMPLANT
SOLUTION ANTI FOG 6CC (MISCELLANEOUS) ×3 IMPLANT
STAPLE ECHEON FLEX 60 POW ENDO (STAPLE) ×3 IMPLANT
STAPLER RELOAD BLUE 60MM (STAPLE) ×3
STAPLER RELOAD GOLD 60MM (STAPLE) ×15
STAPLER RELOAD WHITE 60MM (STAPLE)
SUT MNCRL AB 4-0 PS2 18 (SUTURE) ×6 IMPLANT
SUT RELOAD ENDO STITCH 2 48X1 (ENDOMECHANICALS) ×7
SUT RELOAD ENDO STITCH 2.0 (ENDOMECHANICALS) ×5
SUT SURGIDAC NAB ES-9 0 48 120 (SUTURE) IMPLANT
SUT VIC AB 2-0 SH 27 (SUTURE) ×6
SUT VIC AB 2-0 SH 27X BRD (SUTURE) ×3 IMPLANT
SUTURE RELOAD END STTCH 2 48X1 (ENDOMECHANICALS) ×7 IMPLANT
SUTURE RELOAD ENDO STITCH 2.0 (ENDOMECHANICALS) ×5 IMPLANT
SYR 10ML ECCENTRIC (SYRINGE) ×3 IMPLANT
SYR 20CC LL (SYRINGE) ×6 IMPLANT
SYR 50ML LL SCALE MARK (SYRINGE) ×3 IMPLANT
TOWEL OR 17X26 10 PK STRL BLUE (TOWEL DISPOSABLE) ×3 IMPLANT
TRAY FOLEY W/METER SILVER 14FR (SET/KITS/TRAYS/PACK) ×3 IMPLANT
TRAY FOLEY W/METER SILVER 16FR (SET/KITS/TRAYS/PACK) ×3 IMPLANT
TROCAR ADV FIXATION 11X100MM (TROCAR) IMPLANT
TROCAR ADV FIXATION 12X100MM (TROCAR) ×3 IMPLANT
TROCAR ADV FIXATION 5X100MM (TROCAR) ×3 IMPLANT
TROCAR BLADELESS OPT 5 100 (ENDOMECHANICALS) ×3 IMPLANT
TROCAR UNIVERSAL OPT 12M 100M (ENDOMECHANICALS) ×3 IMPLANT
TROCAR XCEL 12X100 BLDLESS (ENDOMECHANICALS) ×3 IMPLANT
TROCAR XCEL NON-BLD 11X100MML (ENDOMECHANICALS) ×3 IMPLANT
TUBING CONNECTING 10 (TUBING) ×4 IMPLANT
TUBING CONNECTING 10' (TUBING) ×2
TUBING ENDO SMARTCAP (MISCELLANEOUS) ×3 IMPLANT
TUBING FILTER THERMOFLATOR (ELECTROSURGICAL) ×3 IMPLANT

## 2014-11-13 NOTE — Op Note (Signed)
Preoperative diagnosis: Lap band conversion to Roux-en-Y gastric bypass  Postoperative diagnosis: Same   Procedure: Upper endoscopy   Surgeon: Gurney Maxin, M.D.  Anesthesia: Gen.   Indications for procedure: This patient was undergoing a Lap band conversion to Roux-en-Y gastric bypass.   Description of procedure: The endoscopy was placed in the mouth and into the oropharynx and under endoscopic vision it was advanced to the esophagogastric junction. The pouch was insufflated and no bleeding or bubbles were seen. The GEJ was identified at 38cm from the teeth. The anastomosis was identified at 43cm No bleeding or leaks were detected. The scope was withdrawn without difficulty.   Gurney Maxin, M.D. General, Bariatric, & Minimally Invasive Surgery [facility]

## 2014-11-13 NOTE — Anesthesia Preprocedure Evaluation (Signed)
Anesthesia Evaluation  Patient identified by MRN, date of birth, ID band Patient awake    Reviewed: Allergy & Precautions, NPO status , Patient's Chart, lab work & pertinent test results  Airway Mallampati: II   Neck ROM: full    Dental   Pulmonary neg pulmonary ROS,  breath sounds clear to auscultation        Cardiovascular hypertension, Rhythm:regular Rate:Normal     Neuro/Psych  Headaches, Depression    GI/Hepatic   Endo/Other  obese  Renal/GU      Musculoskeletal  (+) Arthritis -,   Abdominal   Peds  Hematology   Anesthesia Other Findings   Reproductive/Obstetrics                             Anesthesia Physical Anesthesia Plan  ASA: II  Anesthesia Plan: General   Post-op Pain Management:    Induction: Intravenous  Airway Management Planned: Oral ETT  Additional Equipment:   Intra-op Plan:   Post-operative Plan: Extubation in OR  Informed Consent: I have reviewed the patients History and Physical, chart, labs and discussed the procedure including the risks, benefits and alternatives for the proposed anesthesia with the patient or authorized representative who has indicated his/her understanding and acceptance.     Plan Discussed with: CRNA, Anesthesiologist and Surgeon  Anesthesia Plan Comments:         Anesthesia Quick Evaluation

## 2014-11-13 NOTE — Anesthesia Procedure Notes (Signed)
Procedure Name: Intubation Date/Time: 11/13/2014 7:32 AM Performed by: Sherian Maroon A Pre-anesthesia Checklist: Patient identified, Emergency Drugs available, Suction available, Patient being monitored and Timeout performed Patient Re-evaluated:Patient Re-evaluated prior to inductionOxygen Delivery Method: Circle system utilized Preoxygenation: Pre-oxygenation with 100% oxygen Intubation Type: IV induction Ventilation: Mask ventilation without difficulty Grade View: Grade II Tube type: Oral Tube size: 7.5 mm Number of attempts: 1 Airway Equipment and Method: Stylet Placement Confirmation: ETT inserted through vocal cords under direct vision,  positive ETCO2 and breath sounds checked- equal and bilateral Secured at: 20.5 cm Tube secured with: Tape Dental Injury: Teeth and Oropharynx as per pre-operative assessment

## 2014-11-13 NOTE — Interval H&P Note (Signed)
History and Physical Interval Note:  11/13/2014 7:14 AM  Shawna Ellison  has presented today for surgery, with the diagnosis of Morbid obesity  The various methods of treatment have been discussed with the patient and family.  [name] is here with her.  After consideration of risks, benefits and other options for treatment, the patient has consented to  Procedure(s): LAPAROSCOPIC REMOVAL OF GASTRIC BAND (N/A) LAPAROSCOPIC ROUX-EN-Y GASTRIC (N/A) as a surgical intervention .  The patient's history has been reviewed, patient examined, no change in status, stable for surgery.  I have reviewed the patient's chart and labs.  Questions were answered to the patient's satisfaction.     [name] H

## 2014-11-13 NOTE — Progress Notes (Signed)
Utilization review completed.  

## 2014-11-13 NOTE — Anesthesia Postprocedure Evaluation (Signed)
Anesthesia Post Note  Patient: Shawna Ellison  Procedure(s) Performed: Procedure(s) (LRB): LAPAROSCOPIC REMOVAL OF GASTRIC BAND (N/A) LAPAROSCOPIC ROUX-EN-Y GASTRIC (N/A)  Anesthesia type: General  Patient location: PACU  Post pain: Pain level controlled and Adequate analgesia  Post assessment: Post-op Vital signs reviewed, Patient's Cardiovascular Status Stable, Respiratory Function Stable, Patent Airway and Pain level controlled  Last Vitals:  Filed Vitals:   11/13/14 1630  BP: 131/75  Pulse: 101  Temp: 36.6 C  Resp: 18    Post vital signs: Reviewed and stable  Level of consciousness: awake, alert  and oriented  Complications: No apparent anesthesia complications

## 2014-11-13 NOTE — Transfer of Care (Signed)
Immediate Anesthesia Transfer of Care Note  Patient: Shawna Ellison  Procedure(s) Performed: Procedure(s): LAPAROSCOPIC REMOVAL OF GASTRIC BAND (N/A) LAPAROSCOPIC ROUX-EN-Y GASTRIC (N/A)  Patient Location: PACU  Anesthesia Type:General  Level of Consciousness: awake, alert , oriented and patient cooperative  Airway & Oxygen Therapy: Patient Spontanous Breathing and Patient connected to face mask oxygen  Post-op Assessment: Report given to RN and Post -op Vital signs reviewed and stable  Post vital signs: Reviewed and stable  Last Vitals:  Filed Vitals:   11/13/14 0521  BP: 143/72  Pulse: 85  Temp: 36.3 C  Resp: 16    Complications: No apparent anesthesia complications

## 2014-11-13 NOTE — Op Note (Signed)
PATIENT:   Shawna Ellison DOB:   [birth year] MRN:   [id number]  DATE OF PROCEDURE: 11/13/2014                   FACILITY:  [facility]  OPERATIVE REPORT  PREOPERATIVE DIAGNOSIS:  Morbid obesity.  POSTOPERATIVE DIAGNOSIS:  Morbid obesity (weight 220, BMI of 39).  PROCEDURE:  Removal of lap band, conversion to laparoscopic Roux-en-Y gastric bypass, antecolic, antegastric (intraoperative upper endoscopy by Dr. Sherrill Raring)  SURGEON:  Fenton Malling. Lucia Gaskins, MD  FIRST ASSISTANTSherrill Raring, MD  ANESTHESIA:  General endotracheal.  Anesthesiologist: Albertha Ghee, MD CRNA: Lavina Hamman, CRNA; Garrel Ridgel, CRNA; Lollie Sails, CRNA  General  ESTIMATED BLOOD LOSS:  Minimal.  LOCAL ANESTHESIA:  30 cc of 6/1% Marcaine  COMPLICATIONS:  None.  INDICATION FOR SURGERY:  LAKERA VIALL is a 69 y.o. white  female who sees Scarlette Calico, MD as her primary care doctor.  She had a lap band placed by Dr. Zettie Pho on 09/06/2007.  Her initial weight was 223 at the time of the lap band.  She got down to 185, but has not be able to sustain the weight loss.  So she wants her lap band converted to a gastric bypass.  She has completed our preoperative bariatric program and now comes for removal of her lap band and laparoscopic Roux-en-Y gastric bypass.  The indications, potential complications of surgery were explained to the patient.  Potential complications of the surgery include, but are not limited to, bleeding, infection, DVT, open surgery, and long-term nutritional consequences.  She understands that redo bariatric surgery carries a higher risk.  OPERATIVE NOTE:  The patient taken to room #4 at [facility] where [name] underwent a general endotracheal anesthetic, supervised by Anesthesiologist: Albertha Ghee, MD CRNA: Lavina Hamman, CRNA; Garrel Ridgel, CRNA; Lollie Sails, CRNA.  The patient was given 2 g of cefoxitin at the beginning of the procedure.  A time-out was held and surgical checklist  run.  The abdomen was prepped with ChloraPrep and sterilely draped.  I accessed the abdominal cavity through the left upper quadrant using a 12 mm Optiview trocar.  I placed 6 additional trocars: 5 mm subxiphoid, 12 mm right subcostal, 12 mm right paramedian, 12 mm left paramedian, 5 mm lateral subcostal, and a 11 mm below to the right of the umbilicus.  The abdomen was insufflated and abdominal exploration carried out.  Right and left lobes of liver unremarkable.  The stomach that I could see was unremarkable.  The patient had a moderate amount of greater omentum which draped over the bowel.  First, I dissected around the lap band.  The lap band actually had few adhesions to the liver or to the diaphragm.  I divided these adhesions and unlatched the lap band.  The lap band was then cut and brought out of the abdomen in 2 pieces.  I inspected the stomach and thought that it was reasonable to proceed with the gastric bypass.  Of note, she did have a lot of central obesity with a large omentum.  I was able to push the omentum and transverse colon up and identified the ligament of Treitz to start the operation.  I measured 40 cm of the jejunum, starting at the ligament of Tritz, and divided the jejunum with a white load of 45 mm Ethicon Endo-GIA stapler.  I divided a short length into the mesentery.  I measured 100 cm of jejunum for the  future gastric limb.  I put a Penrose drain on the future gastric limb of the jejunum.  I then did a side-to-side jejunojejunostomy.  I used a 45 mm white load of the Ethicon Endo-GIA stapler.  I closed the enterotomy with 2 running 2-0 Vicryl sutures.  The was a small rent in the serosa distally from the anastomosis that required a 2-0 vicryl suture.  I tested the JJ anastomosis with an alligator forceps and then covered this with Tisseel.  I closed the mesenteric defect with a running 2-0 silk suture with a Laparo-tye on each end.  I then divided the omentum with a  Harmonic Scalpel.  The omentum was noted to be large and thick.  I positioned the patient in reverse Trendelenburg and placed the liver retractor, which was introduced into the peritoneal cavity through a subxiphoid 5 mm trocar puncture, under the left lobe of the liver.  I then identified the gastroesophageal junction.  I went to the left at the angle of His and made a window at the left side of the esophago-gastric junction for a target as my dissection.  I then went on the lesser curve of the stomach, measured 5 cm from the gastroesophageal junction down the lesser curve and dissected into the lesser sac from the lesser curvature side of the stomach.  Her stomach seemed thicker than normal to me.  Whether this was because of the prior lap band surgery or she was naturally this way was unclear.  I did upsize my gastric stapling to "gold" staples.  I did the first firing of a 60 mm Gold load Ethicon Endo-GIA stapler and then did 4 firings of the 60 mm Gold load Ethicon Eschelon stapler.  There was one final 45 blue Ethicon stapler to complete the gastric dissection.  Because of her prior lap band, I had anesthesia pass the Ewald tube, but she could not get it down without feeling that she was forcing it.  Dr. Kieth Brightly broke scrub and did an upper endoscopy.  We had identified our anatomy correctly and could not see a reason for the Ewald tube not to pass.  This created a gastric pouch approximately 5 cm in length and 3 cm in width.  There was no bleeding from either the pouch or the stomach remnant site.  I placed Tisseel on the pouch side along the new greater curvature.  I over sewed the gastric remnant with a locking 2-0 Vicryl suture with a Laparo-tye on each end..  I then brought the jejunum ante-colic, ante-gastric up to the new stomach pouch and placed a posterior running 2-0 Vicryl suture.  I then made an enterotomy into the stomach using the Ewald as a back stop and an enterotomy into the  jejunum.  I did a stapled side-to-side gastrojejunal anastomosis using these two enterotomies with a 45 mm blue load of the Ethicon Endo GIA stapler.  I tried to create a 2.5 cm gastrojejunal anastomosis.  I closed the enterotomy with a 2 running 2-0 Vicryl sutures.  I passed the Ewald tube through the gastrojejunal anastomosis and then did an anterior Connell suture running of 2-0 Vicryl suture for the anterior layer of the gastrojejunostomy.  The Ewald tube was then removed without difficulty.  I then closed the [place] defect with a figure-of-eight 2-0 silk suture between the mesentery of the transverse colon and the mesentery of the distal jejunum.  Dr. Kieth Brightly then scrubbed out and did an intraoperative upper endoscopy.  He identified  the esophagogastric junction about 38 cm, the gastrojejunal anastomosis about 43 cm.  I clamped off the small bowel.  He insufflated air and I flooded the abdomen with saline. There was no bubbling or evidence of air leak.  He then withdrew the scope and he will dictate that portion of the operation.    I then re-inspected the anastomoses, sucked out the saline, placed Tisseel over the stomach pouch and gastrojejunal anastomosis.   The liver retractor was removed.  The trocars were removed.  There was no bleeding at any trocar site.   I then remove the lap band port by enlarging the right para median incision.  The remainder of the lap band was removed.  I closed the subcutaneous tissue with 3-0 vicryls.   The skin at each trocar site was closed with a 4-0 Monocryl suture.  I infiltrated a total about 30 cc of 0.25% Marcaine at the trocar sites.    After the skin incisions were closed with sutures they were painted with Dermabond.  The sponge and needle count were correct at the end of the case.  The patient tolerated the procedure well, was transported to the recovery room in good condition.   Alphonsa Overall, MD, [facility] Surgery Pager:  [phone] Office phone:  [phone]

## 2014-11-14 ENCOUNTER — Encounter (HOSPITAL_COMMUNITY): Payer: Self-pay | Admitting: Surgery

## 2014-11-14 ENCOUNTER — Inpatient Hospital Stay (HOSPITAL_COMMUNITY): Payer: Medicare Other

## 2014-11-14 DIAGNOSIS — Z9889 Other specified postprocedural states: Secondary | ICD-10-CM

## 2014-11-14 LAB — CBC WITH DIFFERENTIAL/PLATELET
BASOS ABS: 0 10*3/uL (ref 0.0–0.1)
Basophils Relative: 0 % (ref 0–1)
EOS ABS: 0 10*3/uL (ref 0.0–0.7)
Eosinophils Relative: 0 % (ref 0–5)
HCT: 37.4 % (ref 36.0–46.0)
HEMOGLOBIN: 11.9 g/dL — AB (ref 12.0–15.0)
LYMPHS ABS: 1.8 10*3/uL (ref 0.7–4.0)
LYMPHS PCT: 20 % (ref 12–46)
MCH: 30.4 pg (ref 26.0–34.0)
MCHC: 31.8 g/dL (ref 30.0–36.0)
MCV: 95.4 fL (ref 78.0–100.0)
Monocytes Absolute: 0.9 10*3/uL (ref 0.1–1.0)
Monocytes Relative: 10 % (ref 3–12)
NEUTROS PCT: 70 % (ref 43–77)
Neutro Abs: 6.2 10*3/uL (ref 1.7–7.7)
Platelets: 255 10*3/uL (ref 150–400)
RBC: 3.92 MIL/uL (ref 3.87–5.11)
RDW: 13.4 % (ref 11.5–15.5)
WBC: 8.9 10*3/uL (ref 4.0–10.5)

## 2014-11-14 LAB — HEMOGLOBIN AND HEMATOCRIT, BLOOD
HCT: 39.5 % (ref 36.0–46.0)
HEMOGLOBIN: 12.5 g/dL (ref 12.0–15.0)

## 2014-11-14 MED ORDER — IOHEXOL 300 MG/ML  SOLN
50.0000 mL | Freq: Once | INTRAMUSCULAR | Status: DC | PRN
  Administered 2014-11-14: 40 mL via ORAL
  Filled 2014-11-14: qty 50

## 2014-11-14 NOTE — Progress Notes (Signed)
Patient alert and oriented, Post op day 1.  Provided support and encouragement.  Encouraged pulmonary toilet, ambulation and small sips of liquids when swallow study returned satisfactorily.  All questions answered.  Will continue to monitor. 

## 2014-11-14 NOTE — Progress Notes (Signed)
VASCULAR LAB PRELIMINARY  PRELIMINARY  PRELIMINARY  PRELIMINARY  Bilateral lower extremity venous duplex completed.    Preliminary report:  Bilateral:  No evidence of DVT, superficial thrombosis, or Baker's Cyst.   Theran Vandergrift, RVS 11/14/2014, 9:12 AM

## 2014-11-14 NOTE — Progress Notes (Signed)
General Surgery Note  LOS: 1 day  POD -  1 Day Post-Op  Assessment/Plan: 1.  LAPAROSCOPIC REMOVAL OF GASTRIC BAND, LAPAROSCOPIC ROUX-EN-Y GASTRIC - 11/13/2014 - D. Linzie Boursiquot  Doing well  For swallow today  2. HTN - just started meds Dec 2015 3. Sees Dr. Theda Sers for knees ** She is scheduled to have a right knee replacement in Sept 2016 - I recommended that she wait for at least 3 months after surgery before doing this. 4. Incontinence - She saw Dr. Tresa Moore once 5.  DVT prophylaxis - SQ Heparin   Active Problems:   Morbid obesity   Subjective:  Had good night.  No nausea.  Walked in hall  [place], in room with patient. Objective:   Filed Vitals:   11/14/14 0530  BP: 118/67  Pulse: 92  Temp: 98.7 F (37.1 C)  Resp: 18     Intake/Output from previous day:  08/22 0701 - 08/23 0700 In: 5172.5 [I.V.:5072.5; IV Piggyback:100] Out: 3748 [Urine:1190; Blood:20]  Intake/Output this shift:      Physical Exam:   General: Obese AF who is alert and oriented.    HEENT: Normal. Pupils equal. .   Lungs: Clear.  IS - 1,500+ cc   Abdomen: Soft, has few BS   Wound: Clean    Lab Results:    Recent Labs  11/13/14 1344 11/14/14 0443  WBC  --  8.9  HGB 12.3 11.9*  HCT 39.6 37.4  PLT  --  255    BMET  No results for input(s): NA, K, CL, CO2, GLUCOSE, BUN, CREATININE, CALCIUM in the last 72 hours.  PT/INR  No results for input(s): LABPROT, INR in the last 72 hours.  ABG  No results for input(s): PHART, HCO3 in the last 72 hours.  Invalid input(s): PCO2, PO2   Studies/Results:  No results found.   Anti-infectives:   Anti-infectives    Start     Dose/Rate Route Frequency Ordered Stop   11/13/14 0536  cefOXitin (MEFOXIN) 2 g in dextrose 5 % 50 mL IVPB     2 g 100 mL/hr over 30 Minutes Intravenous On call to O.R. 11/13/14 0536 11/13/14 2707      Alphonsa Overall, MD, FACS Pager: [facility] Office: [phone] 11/14/2014

## 2014-11-14 NOTE — Progress Notes (Signed)
Pt. Is awake, alert and oriented X4 and has been cooperative with her post-op plan for rehab.  Pt. Has ambulated around the hallways twice each time today and has tolerated the activity well.  Only c/o pain this afternoon was of a headache and acetaminophen relieved it.  Pt.'s last pain med for surgical pain was shortly after returning from a procedure today. Pt. Tolerating ice chips without problems and has voided.

## 2014-11-14 NOTE — Plan of Care (Signed)
Problem: Food- and Nutrition-Related Knowledge Deficit (NB-1.1) Goal: Nutrition education Formal process to instruct or train a patient/client in a skill or to impart knowledge to help patients/clients voluntarily manage or modify food choices and eating behavior to maintain or improve health. Outcome: Completed/Met Date Met:  11/14/14 Nutrition Education Note  Received consult for diet education per DROP protocol.   Discussed 2 week post op diet with pt. Emphasized that liquids must be non carbonated, non caffeinated, and sugar free. Fluid goals discussed. Pt to follow up with outpatient bariatric RD for further diet progression after 2 weeks. Multivitamins and minerals also reviewed. Teach back method used, pt expressed understanding, expect good compliance.   Diet: First 2 Weeks  You will see the nutritionist about two (2) weeks after your surgery. The nutritionist will increase the types of foods you can eat if you are handling liquids well:  If you have severe vomiting or nausea and cannot handle clear liquids lasting longer than 1 day, call your surgeon  Protein Shake  Drink at least 2 ounces of shake 5-6 times per day  Each serving of protein shakes (usually 8 - 12 ounces) should have a minimum of:  15 grams of protein  And no more than 5 grams of carbohydrate  Goal for protein each day:  Men = 80 grams per day  Women = 60 grams per day  Protein powder may be added to fluids such as non-fat milk or Lactaid milk or Soy milk (limit to 35 grams added protein powder per serving)   Hydration  Slowly increase the amount of water and other clear liquids as tolerated (See Acceptable Fluids)  Slowly increase the amount of protein shake as tolerated  Sip fluids slowly and throughout the day  May use sugar substitutes in small amounts (no more than 6 - 8 packets per day; i.e. Splenda)   Fluid Goal  The first goal is to drink at least 8 ounces of protein shake/drink per day (or as directed  by the nutritionist); some examples of protein shakes are Syntrax Nectar, Adkins Advantage, EAS Edge HP, and Unjury. See handout from pre-op Bariatric Education Class:  Slowly increase the amount of protein shake you drink as tolerated  You may find it easier to slowly sip shakes throughout the day  It is important to get your proteins in first  Your fluid goal is to drink 64 - 100 ounces of fluid daily  It may take a few weeks to build up to this  32 oz (or more) should be clear liquids  And  32 oz (or more) should be full liquids (see below for examples)  Liquids should not contain sugar, caffeine, or carbonation   Clear Liquids:  Water or Sugar-free flavored water (i.e. Fruit H2O, Propel)  Decaffeinated coffee or tea (sugar-free)  Crystal Lite, Wyler's Lite, Minute Maid Lite  Sugar-free Jell-O  Bouillon or broth  Sugar-free Popsicle: *Less than 20 calories each; Limit 1 per day   Full Liquids:  Protein Shakes/Drinks + 2 choices per day of other full liquids  Full liquids must be:  No More Than 12 grams of Carbs per serving  No More Than 3 grams of Fat per serving  Strained low-fat cream soup  Non-Fat milk  Fat-free Lactaid Milk  Sugar-free yogurt (Dannon Lite & Fit, Greek yogurt)     Shawna Trombly, MS, RD, LDN Pager: [phone] After Hours Pager: [phone]        

## 2014-11-15 LAB — CBC WITH DIFFERENTIAL/PLATELET
BASOS ABS: 0 10*3/uL (ref 0.0–0.1)
BASOS PCT: 0 % (ref 0–1)
Eosinophils Absolute: 0.2 10*3/uL (ref 0.0–0.7)
Eosinophils Relative: 3 % (ref 0–5)
HEMATOCRIT: 37.2 % (ref 36.0–46.0)
HEMOGLOBIN: 11.4 g/dL — AB (ref 12.0–15.0)
Lymphocytes Relative: 38 % (ref 12–46)
Lymphs Abs: 2.5 10*3/uL (ref 0.7–4.0)
MCH: 29.8 pg (ref 26.0–34.0)
MCHC: 30.6 g/dL (ref 30.0–36.0)
MCV: 97.4 fL (ref 78.0–100.0)
MONOS PCT: 13 % — AB (ref 3–12)
Monocytes Absolute: 0.9 10*3/uL (ref 0.1–1.0)
NEUTROS ABS: 3.2 10*3/uL (ref 1.7–7.7)
NEUTROS PCT: 46 % (ref 43–77)
Platelets: 219 10*3/uL (ref 150–400)
RBC: 3.82 MIL/uL — AB (ref 3.87–5.11)
RDW: 13.7 % (ref 11.5–15.5)
WBC: 6.8 10*3/uL (ref 4.0–10.5)

## 2014-11-15 NOTE — Progress Notes (Signed)
Discharge instructions given to patient and husband.  Questions answered.

## 2014-11-15 NOTE — Progress Notes (Signed)
Patient alert and oriented, pain is controlled. Patient is tolerating fluids,  advanced to protein shake today, patient tolerated well. Reviewed Gastric Bypass discharge instructions with patient and patient is able to articulate understanding. Provided information on BELT program, Support Group and [facility] outpatient pharmacy. All questions answered, will continue to monitor.    

## 2014-11-15 NOTE — Care Management Important Message (Signed)
Important Message  Patient Details  Name: AMARIONNA ARCA MRN: [id number] Date of Birth: 1945-09-29   Medicare Important Message Given:  [facility] notification given    Camillo Flaming 11/15/2014, 2:01 [place] Message  Patient Details  Name: DANYELLE BROOKOVER MRN: [id number] Date of Birth: [birth year]   Medicare Important Message Given:  Yes-second notification given    Camillo Flaming 11/15/2014, 2:01 PM

## 2014-11-15 NOTE — Discharge Instructions (Addendum)
[facility] - DISCHARGE INSTRUCTIONS TO PATIENT  Activity:  Driving - May drive in 3 or 4 days, if doing well   Lifting - No lifting more than 15 pounds for one week, then no limit  Wound Care:   May shower  Diet:  Post op bypass diet - protein shakes.        Drink plenty of water  Follow up appointment:  Call Dr. Pollie Friar office [facility]) at [phone] for an appointment in 2 to 3 weeks  Medications and dosages:  Resume your home medications.  You have a prescription for:  Oxycodone elixir  Call Dr. Lucia Gaskins or his office  [phone]) if you have:  Temperature greater than 100.4,  Persistent nausea and vomiting,  Severe uncontrolled pain,  Redness, tenderness, or signs of infection (pain, swelling, redness, odor or green/yellow discharge around the site),  Difficulty breathing, headache or visual disturbances,  Any other questions or concerns you may have after discharge.  In an emergency, call 911 or go to an Emergency Department at a nearby hospital.       GASTRIC BYPASS/SLEEVE  Home Care Instructions   These instructions are to help you care for yourself when you go home.  Call: If you have any problems. Call [phone] and ask for the surgeon on call If you need immediate assistance come to the ER at [facility]. Tell the ER staff you are a new post-op gastric bypass or gastric sleeve patient  Signs and symptoms to report: Severe  vomiting or nausea If you cannot handle clear liquids for longer than 1 day, call your surgeon Abdominal pain which does not get better after taking your pain medication Fever greater than 100.4  F and chills Heart rate over 100 beats a minute Trouble breathing Chest pain Redness,  swelling, drainage, or foul odor at incision (surgical) sites If your incisions open or pull apart Swelling or pain in calf (lower leg) Diarrhea (Loose bowel movements that happen often), frequent watery, uncontrolled bowel  movements Constipation, (no bowel movements for 3 days) if this happens: Take Milk of Magnesia, 2 tablespoons by mouth, 3 times a day for 2 days if needed Stop taking Milk of Magnesia once you have had a bowel movement Call your doctor if constipation continues Or Take Miralax  (instead of Milk of Magnesia) following the label instructions Stop taking Miralax once you have had a bowel movement Call your doctor if constipation continues Anything you think is abnormal for you   Normal side effects after surgery: Unable to sleep at night or unable to concentrate Irritability Being tearful (crying) or depressed  These are common complaints, possibly related to your anesthesia, stress of surgery, and change in lifestyle, that usually go away a few weeks after surgery. If these feelings continue, call your medical doctor.  Wound Care: You may have surgical glue, steri-strips, or staples over your incisions after surgery Surgical glue: Looks like clear film over your incisions and will wear off a little at a time Steri-strips: Adhesive strips of tape over your incisions. You may notice a yellowish color on skin under the steri-strips. This is used to make the steri-strips stick better. Do not pull the steri-strips off - let them fall off Staples: Staples may be removed before you leave the hospital If you go home with staples, call [facility] for an appointment with your surgeons nurse to have staples removed 10 days after surgery, [phone] Showering: You may shower two (  2) days after your surgery unless your surgeon tells you differently Wash gently around incisions with warm soapy water, rinse well, and gently pat dry If you have a drain (tube from your incision), you may need someone to hold this while you shower No tub baths until staples are removed and incisions are healed   Medications: Medications should be liquid or crushed if larger than the size of a  dime Extended release pills (medication that releases a little bit at a time through the  day) should not be crushed Depending on the size and number of medications you take, you may need to space (take a few throughout the day)/change the time you take your medications so that you do not over-fill your pouch (smaller stomach) Make sure you follow-up with you primary care physician to make medication changes needed during rapid weight loss and life -style changes If you have diabetes, follow up with your doctor that orders your diabetes medication(s) within one week after surgery and check your blood sugar regularly  Do not drive while taking narcotics (pain medications)  Do not take acetaminophen (Tylenol) and Roxicet or Lortab Elixir at the same time since these pain medications contain acetaminophen   Diet:  First 2 Weeks You will see the nutritionist about two (2) weeks after your surgery. The nutritionist will increase the types of foods you can eat if you are handling liquids well: If you have severe vomiting or nausea and cannot handle clear liquids lasting longer than 1 day call your surgeon Protein Shake Drink at least 2 ounces of shake 5-6 times per day Each serving of protein shakes (usually 8-12 ounces) should have a minimum of: 15 grams of protein And no more than 5 grams of carbohydrate Goal for protein each day: Men = 80 grams per day Women = 60 grams per day    Protein powder may be added to fluids such as non-fat milk or Lactaid milk or Soy milk (limit to 35 grams added protein powder per serving)  Hydration Slowly increase the amount of water and other clear liquids as tolerated (See Acceptable Fluids) Slowly increase the amount of protein shake as tolerated Sip fluids slowly and throughout the day May use sugar substitutes in small amounts (no more than 6-8 packets per day; i.e. Splenda)  Fluid Goal The first goal is to drink at least 8 ounces of protein shake/drink  per day (or as directed by the nutritionist); some examples of protein shakes are [employer], [employer], EAS Edge HP, and Unjury. - See handout from pre-op Bariatric Education Class: Slowly increase the amount of protein shake you drink as tolerated You may find it easier to slowly sip shakes throughout the day It is important to get your proteins in first Your fluid goal is to drink 64-100 ounces of fluid daily It may take a few weeks to build up to this  32 oz. (or more) should be clear liquids And 32 oz. (or more) should be full liquids (see below for examples) Liquids should not contain sugar, caffeine, or carbonation  Clear Liquids: Water of Sugar-free flavored water (i.e. Fruit HO, Propel) Decaffeinated coffee or tea (sugar-free) Crystal lite, Wylers Lite, Minute Maid Lite Sugar-free Jell-O Bouillon or broth Sugar-free Popsicle:    - Less than 20 calories each; Limit 1 per day  Full Liquids:                   Protein Shakes/Drinks + 2 choices per day  of other full liquids Full liquids must be: No More Than 12 grams of Carbs per serving No More Than 3 grams of Fat per serving Strained low-fat cream soup Non-Fat milk Fat-free Lactaid Milk Sugar-free yogurt (Dannon Lite & Fit, Greek yogurt)    Vitamins and Minerals Start 1 day after surgery unless otherwise directed by your surgeon 2 Chewable Multivitamin / Multimineral Supplement with iron (i.e. Centrum for Adults) Vitamin B-12, 350-500 micrograms sub-lingual (place tablet under the tongue) each day Chewable Calcium Citrate with Vitamin D-3 (Example: 3 Chewable Calcium  Plus 600 with Vitamin D-3) Take 500 mg three (3) times a day for a total of 1500 mg each day Do not take all 3 doses of calcium at one time as it may cause constipation, and you can only absorb 500 mg at a time Do not mix multivitamins containing iron with calcium supplements;  take 2 hours apart Do not substitute Tums (calcium carbonate) for  your calcium Menstruating women and those at risk for anemia ( a blood disease that causes weakness) may need extra iron Talk to your doctor to see if you need more iron If you need extra iron: Total daily Iron recommendation (including Vitamins) is 50 to 100 mg Iron/day Do not stop taking or change any vitamins or minerals until you talk to your nutritionist or surgeon Your nutritionist and/or surgeon must approve all vitamin and mineral supplements   Activity and Exercise: It is important to continue walking at home. Limit your physical activity as instructed by your doctor. During this time, use these guidelines: Do not lift anything greater than ten  (10) pounds for at least two (2) weeks Do not go back to work or drive until Engineer, production says you can You may have sex when you feel comfortable It is VERY important for female patients to use a reliable birth control method; fertility often increase after surgery Do not get pregnant for at least 18 months Start exercising as soon as your doctor tells you that you can Make sure your doctor approves any physical activity Start with a simple walking program Walk 5-15 minutes each day, 7 days per week Slowly increase until you are walking 30-45 minutes per day Consider joining our [place] program. [phone] or email [email or web address]   Special Instructions Things to remember: Free counseling is available for you and your family through collaboration between [facility] and [place]. Please call [phone] and leave a message Use your CPAP when sleeping if this applies to you Consider buying a medical alert bracelet that says you had lap-band surgery     You will likely have your first fill (fluid added to your band) 6 - 8 weeks after surgery [facility] has a free Bariatric Surgery Support Group that meets monthly, the 3rd Thursday, [place]. You can see classes online at  [email or web address] It is very important to keep all follow up appointments with your surgeon, nutritionist, primary care physician, and behavioral health practitioner After the first year, please follow up with your bariatric surgeon and nutritionist at least once a year in order to maintain best weight loss results                    [facility]:  [place]: [phone]  Bariatric Nurse Coordinator: [phone]  Gastric Bypass/Sleeve Home Care Instructions  Rev. 04/2012                                                         Reviewed and Endorsed                                                    by [facility] Patient Education Committee, Jan, 2014

## 2014-11-15 NOTE — Discharge Summary (Signed)
Physician Discharge Summary  Patient ID:  Shawna Ellison  MRN: [id number]  DOB/AGE: 11-22-1945 69 y.o.  Admit date: 11/13/2014 Discharge date: 11/15/2014  Discharge Diagnoses:  1.  Morbid obesity - weight - 223, BMI 39.6 2. HTN - just started meds Dec 2015 3. Sees Dr. Theda Sers for knees Note - She is scheduled to have a right knee replacement in Sept 2016 - I recommended that she wait for at least 3 months after surgery before doing this. 4. Incontinence - She saw Dr. Tresa Moore once  Active Problems:   Morbid obesity  Operation: Procedure(s): LAPAROSCOPIC REMOVAL OF GASTRIC BAND, LAPAROSCOPIC ROUX-EN-Y GASTRIC on 11/13/2014 - [facility]  Discharged Condition: good  Hospital Course: MUNTAHA Ellison is an 69 y.o. female whose primary care physician is Scarlette Calico, MD and who was admitted 11/13/2014 with a chief complaint of morbid obesity.  She had a lap band placed by Dr. Zettie Pho on 09/06/2007.  Initially she did well with weight loss to 185.  But over time, she has struggled using the lap band to control her weight.  We discussed revisional surgery - converting her lap band to a gastric bypass.  She was brought to the operating room on 11/13/2014 and underwent  LAPAROSCOPIC REMOVAL OF GASTRIC BAND, LAPAROSCOPIC ROUX-EN-Y GASTRIC. Her swallow the first post op day went well. She has tolerated water and is being advanced to her protien drinks.  She is ready to go home today.  The discharge instructions were reviewed with the patient.  Consults: None  Significant Diagnostic Studies: Results for orders placed or performed during the hospital encounter of 11/13/14  Hemoglobin and hematocrit, blood  Result Value Ref Range   Hemoglobin 12.3 12.0 - 15.0 g/dL   HCT 39.6 36.0 - 46.0 %  CBC WITH DIFFERENTIAL  Result Value Ref Range   WBC 8.9 4.0 - 10.5 K/uL   RBC 3.92 3.87 - 5.11 MIL/uL   Hemoglobin 11.9 (L) 12.0 - 15.0 g/dL   HCT 37.4 36.0 - 46.0 %   MCV 95.4 78.0 - 100.0 fL   MCH  30.4 26.0 - 34.0 pg   MCHC 31.8 30.0 - 36.0 g/dL   RDW 13.4 11.5 - 15.5 %   Platelets 255 150 - 400 K/uL   Neutrophils Relative % 70 43 - 77 %   Neutro Abs 6.2 1.7 - 7.7 K/uL   Lymphocytes Relative 20 12 - 46 %   Lymphs Abs 1.8 0.7 - 4.0 K/uL   Monocytes Relative 10 3 - 12 %   Monocytes Absolute 0.9 0.1 - 1.0 K/uL   Eosinophils Relative 0 0 - 5 %   Eosinophils Absolute 0.0 0.0 - 0.7 K/uL   Basophils Relative 0 0 - 1 %   Basophils Absolute 0.0 0.0 - 0.1 K/uL  Hemoglobin and hematocrit, blood  Result Value Ref Range   Hemoglobin 12.5 12.0 - 15.0 g/dL   HCT 39.5 36.0 - 46.0 %  CBC with Differential  Result Value Ref Range   WBC 6.8 4.0 - 10.5 K/uL   RBC 3.82 (L) 3.87 - 5.11 MIL/uL   Hemoglobin 11.4 (L) 12.0 - 15.0 g/dL   HCT 37.2 36.0 - 46.0 %   MCV 97.4 78.0 - 100.0 fL   MCH 29.8 26.0 - 34.0 pg   MCHC 30.6 30.0 - 36.0 g/dL   RDW 13.7 11.5 - 15.5 %   Platelets 219 150 - 400 K/uL   Neutrophils Relative % 46 43 - 77 %   Neutro Abs  3.2 1.7 - 7.7 K/uL   Lymphocytes Relative 38 12 - 46 %   Lymphs Abs 2.5 0.7 - 4.0 K/uL   Monocytes Relative 13 (H) 3 - 12 %   Monocytes Absolute 0.9 0.1 - 1.0 K/uL   Eosinophils Relative 3 0 - 5 %   Eosinophils Absolute 0.2 0.0 - 0.7 K/uL   Basophils Relative 0 0 - 1 %   Basophils Absolute 0.0 0.0 - 0.1 K/uL    Dg Ugi W/water Sol Cm  11/14/2014   CLINICAL DATA:  1 day status post Roux-en-Y gastric bypass surgery  EXAM: UPPER GI SERIES WITH KUB  TECHNIQUE: After obtaining a scout radiograph a routine upper GI series was performed using water-soluble contrast  FLUOROSCOPY TIME:  Radiation Exposure Index (as provided by the fluoroscopic device):  If the device does not provide the exposure index:  Fluoroscopy Time (in minutes and seconds):  44 seconds  Number of Acquired Images:  9  COMPARISON:  Upper GI 05/16/2014  FINDINGS: Contrast flowed readily through the gastroesophageal junction into the small gastric remnant. Contrast flowed through the  gastrojejunostomy without evidence obstruction or leak. Mild amount of retained contrast within the esophagus.  IMPRESSION: No evidence of obstruction or leak following Roux-en-Y gastric bypass surgery.  These results will be called to the ordering clinician or representative by the Radiologist Assistant, and communication documented in the PACS or zVision Dashboard.   Electronically Signed   By: Suzy Bouchard M.D.   On: 11/14/2014 11:29    Discharge Exam:  Filed Vitals:   11/15/14 0556  BP: 143/70  Pulse: 83  Temp: 97.5 F (36.4 C)  Resp: 18    General: Obese WF who is alert. Lungs: Clear to auscultation and symmetric breath sounds. Heart:  RRR. No murmur or rub. Abdomen: Soft. No mass. Normal bowel sounds. Incisions look good.  Discharge Medications:     Medication List    TAKE these medications        acetaminophen 500 MG tablet  Commonly known as:  TYLENOL  Take 500 mg by mouth every 6 (six) hours as needed for moderate pain or headache.     Cholecalciferol 2000 UNITS Tabs  Take 1 tablet (2,000 Units total) by mouth daily.     cyanocobalamin 2000 MCG tablet  Take 1 tablet (2,000 mcg total) by mouth daily.     DULoxetine 20 MG capsule  Commonly known as:  CYMBALTA  Take 1 capsule (20 mg total) by mouth 2 (two) times daily.     fluoruracil 1 % cream  Commonly known as:  FLUOROPLEX  Apply topically 2 (two) times daily.     furosemide 20 MG tablet  Commonly known as:  LASIX  Take 20 mg by mouth daily as needed for fluid.     HYDROcodone-acetaminophen 5-325 MG per tablet  Commonly known as:  NORCO/VICODIN  Take 1 tablet by mouth every 4 (four) hours as needed for moderate pain.     PEPTAMEN BARIATRIC PO  Take 1 tablet by mouth daily.     thiamine 100 MG tablet  Commonly known as:  VITAMIN B-1  Take 1 tablet (100 mg total) by mouth daily.     valsartan 160 MG tablet  Commonly known as:  DIOVAN  Take 1 tablet (160 mg total) by mouth daily.         Disposition:       Discharge Instructions    Ambulate hourly while awake    Complete by:  As directed      Call MD for:  difficulty breathing, headache or visual disturbances    Complete by:  As directed      Call MD for:  persistant dizziness or light-headedness    Complete by:  As directed      Call MD for:  persistant nausea and vomiting    Complete by:  As directed      Call MD for:  redness, tenderness, or signs of infection (pain, swelling, redness, odor or green/yellow discharge around incision site)    Complete by:  As directed      Call MD for:  severe uncontrolled pain    Complete by:  As directed      Call MD for:  temperature >101 F    Complete by:  As directed      Diet bariatric full liquid    Complete by:  As directed      Incentive spirometry    Complete by:  As directed   Perform hourly while awake          Activity:  Driving - May drive in 3 or 4 days, if doing well   Lifting - No lifting more than 15 pounds for one week, then no limit  Wound Care:   May shower  Diet:  Post op bypass diet - protein shakes.        Drink plenty of water  Follow up appointment:  Call Dr. Pollie Friar office [facility]) at [phone] for an appointment in 2 to 3 weeks  Medications and dosages:  Resume your home medications.  You have a prescription for:  Oxycodone elixir  Signed: Alphonsa Overall, M.D., [facility] Office:  [phone]  11/15/2014, 7:56 AM

## 2014-11-16 ENCOUNTER — Telehealth: Payer: Self-pay | Admitting: *Deleted

## 2014-11-16 NOTE — Telephone Encounter (Signed)
Pt was on tcm list d/c 8/24/ had Gastric band surgery. Pt will f/u with Dr. Lucia Gaskins...Johny Chess

## 2014-11-20 ENCOUNTER — Telehealth (HOSPITAL_COMMUNITY): Payer: Self-pay

## 2014-11-20 NOTE — Telephone Encounter (Signed)
Made discharge phone call to patient per DROP protocol. Asking the following questions.    1. Do you have someone to care for you now that you are home?  yes 2. Are you having pain now that is not relieved by your pain medication?  no 3. Are you able to drink the recommended daily amount of fluids (48 ounces minimum/day) and protein (60-80 grams/day) as prescribed by the dietitian or nutritional counselor?  Yes, I am getting it 4. Are you taking the vitamins and minerals as prescribed?  yes 5. Do you have the "on call" number to contact your surgeon if you have a problem or question?  yes 6. Are your incisions free of redness, swelling or drainage? (If steri strips, address that these can fall off, shower as tolerated) yes 7. Have your bowels moved since your surgery?  If not, are you passing gas?  Yes 8. Are you up and walking 3-4 times per day?  yes

## 2014-11-23 ENCOUNTER — Encounter: Payer: Self-pay | Admitting: Internal Medicine

## 2014-11-23 ENCOUNTER — Ambulatory Visit (INDEPENDENT_AMBULATORY_CARE_PROVIDER_SITE_OTHER): Payer: Medicare Other | Admitting: Internal Medicine

## 2014-11-23 VITALS — BP 120/78 | HR 86 | Temp 97.8°F | Resp 16 | Ht 63.0 in | Wt 205.0 lb

## 2014-11-23 DIAGNOSIS — E538 Deficiency of other specified B group vitamins: Secondary | ICD-10-CM

## 2014-11-23 DIAGNOSIS — I10 Essential (primary) hypertension: Secondary | ICD-10-CM | POA: Diagnosis not present

## 2014-11-23 DIAGNOSIS — Z9884 Bariatric surgery status: Secondary | ICD-10-CM | POA: Diagnosis not present

## 2014-11-23 MED ORDER — CYANOCOBALAMIN 1000 MCG/ML IJ SOLN
1000.0000 ug | Freq: Once | INTRAMUSCULAR | Status: AC
  Administered 2014-11-23: 1000 ug via SUBCUTANEOUS

## 2014-11-23 NOTE — Patient Instructions (Signed)

## 2014-11-23 NOTE — Progress Notes (Signed)
Subjective:  Patient ID: Shawna Ellison, female    DOB: August 31, 1945  Age: 69 y.o. MRN: [id number]  CC: Hypertension   HPI Shawna Ellison presents for follow-up on blood pressure. She is one week status post gastric bypass. She has had some heartburn, belching and one episode of vomiting. She sees her surgeon later today. Today she asks if she can discontinue her blood pressure medications. Her blood pressures at home have been very well controlled.  Outpatient Prescriptions Prior to Visit  Medication Sig Dispense Refill  . acetaminophen (TYLENOL) 500 MG tablet Take 500 mg by mouth every 6 (six) hours as needed for moderate pain or headache.    . DULoxetine (CYMBALTA) 20 MG capsule Take 1 capsule (20 mg total) by mouth 2 (two) times daily. 180 capsule 3  . fluoruracil (FLUOROPLEX) 1 % cream Apply topically 2 (two) times daily.    . Nutritional Supplements (PEPTAMEN BARIATRIC PO) Take 1 tablet by mouth daily.    Marland Kitchen thiamine (VITAMIN B-1) 100 MG tablet Take 1 tablet (100 mg total) by mouth daily. 90 tablet 3  . furosemide (LASIX) 20 MG tablet Take 20 mg by mouth daily as needed for fluid.     Marland Kitchen HYDROcodone-acetaminophen (NORCO/VICODIN) 5-325 MG per tablet Take 1 tablet by mouth every 4 (four) hours as needed for moderate pain.     . valsartan (DIOVAN) 160 MG tablet Take 1 tablet (160 mg total) by mouth daily. (Patient taking differently: Take 160 mg by mouth at bedtime. ) 90 tablet 3  . Cholecalciferol 2000 UNITS TABS Take 1 tablet (2,000 Units total) by mouth daily. (Patient not taking: Reported on 11/23/2014) 90 tablet 3  . cyanocobalamin 2000 MCG tablet Take 1 tablet (2,000 mcg total) by mouth daily. (Patient not taking: Reported on 11/23/2014) 90 tablet 3   No facility-administered medications prior to visit.    ROS Review of Systems  Constitutional: Negative.  Negative for fever, chills, diaphoresis, appetite change and fatigue.  HENT: Negative.   Eyes: Negative.   Respiratory: Negative.   Negative for cough, choking, chest tightness and shortness of breath.   Cardiovascular: Negative.  Negative for chest pain, palpitations and leg swelling.  Gastrointestinal: Positive for nausea and vomiting. Negative for abdominal pain, diarrhea, constipation, blood in stool and rectal pain.  Endocrine: Negative.   Genitourinary: Negative.  Negative for difficulty urinating.  Musculoskeletal: Negative.   Allergic/Immunologic: Negative.   Neurological: Negative.  Negative for dizziness, speech difficulty, weakness, light-headedness, numbness and headaches.  Hematological: Negative.   Psychiatric/Behavioral: Negative.     Objective:  BP 120/78 mmHg  Pulse 86  Temp(Src) 97.8 F (36.6 C) (Oral)  Resp 16  Ht 5\' 3"  (1.6 m)  Wt 205 lb (92.987 kg)  BMI 36.32 kg/m2  SpO2 95%  BP Readings from Last 3 Encounters:  11/23/14 120/78  11/15/14 143/70  11/09/14 138/79    Wt Readings from Last 3 Encounters:  11/23/14 205 lb (92.987 kg)  11/13/14 208 lb 4 oz (94.462 kg)  11/09/14 213 lb (96.616 kg)    Physical Exam  Constitutional: She is oriented to person, place, and time. She appears well-developed and well-nourished. No distress.  HENT:  Head: Normocephalic and atraumatic.  Mouth/Throat: Oropharynx is clear and moist. No oropharyngeal exudate.  Eyes: Conjunctivae are normal. Right eye exhibits no discharge. Left eye exhibits no discharge. No scleral icterus.  Neck: Normal range of motion. Neck supple. No JVD present. No tracheal deviation present. No thyromegaly present.  Cardiovascular: Normal  rate, regular rhythm, normal heart sounds and intact distal pulses.  Exam reveals no gallop and no friction rub.   No murmur heard. Pulmonary/Chest: Effort normal and breath sounds normal. No stridor. No respiratory distress. She has no wheezes. She has no rales. She exhibits no tenderness.  Abdominal: Soft. Bowel sounds are normal. She exhibits no distension and no mass. There is no tenderness.  There is no rebound and no guarding.  Musculoskeletal: Normal range of motion. She exhibits no edema or tenderness.  Lymphadenopathy:    She has no cervical adenopathy.  Neurological: She is oriented to person, place, and time.  Skin: Skin is warm and dry. No rash noted. She is not diaphoretic. No erythema. No pallor.  Vitals reviewed.   Lab Results  Component Value Date   WBC 6.8 11/15/2014   HGB 11.4* 11/15/2014   HCT 37.2 11/15/2014   PLT 219 11/15/2014   GLUCOSE 98 11/09/2014   CHOL 203* 03/09/2014   TRIG 103.0 03/09/2014   HDL 63.40 03/09/2014   LDLCALC 119* 03/09/2014   ALT 27 11/09/2014   AST 24 11/09/2014   NA 139 11/09/2014   K 3.8 11/09/2014   CL 103 11/09/2014   CREATININE 0.72 11/09/2014   BUN 14 11/09/2014   CO2 29 11/09/2014   TSH 1.08 03/09/2014   HGBA1C 6.2 03/09/2014    No results found.  Assessment & Plan:   Shawna Ellison was seen today for hypertension.  Diagnoses and all orders for this visit:  Essential hypertension, benign- her blood pressure is very well controlled. She has already discontinued taking the furosemide. I have also discontinued the valsartan. I would like to recheck her blood pressure in about 1-2 months and see if she needs to restart therapy.  Vitamin B12 deficiency (non anemic) -     cyanocobalamin ((VITAMIN B-12)) injection 1,000 mcg; Inject 1 mL (1,000 mcg total) into the skin once.  Bariatric surgery status- she sees her surgeon later today.   I have discontinued Shawna Ellison's furosemide, valsartan, and HYDROcodone-acetaminophen. I am also having her maintain her thiamine, Cholecalciferol, cyanocobalamin, DULoxetine, fluoruracil, acetaminophen, Nutritional Supplements (PEPTAMEN BARIATRIC PO), oxyCODONE, and Calcium Carb-Cholecalciferol (LIQUID CALCIUM WITH D3 PO). We administered cyanocobalamin.  Meds ordered this encounter  Medications  . oxyCODONE (ROXICODONE) 5 MG/5ML solution    Sig: TAKE 5-10 MLS BY MOUTH EVERY 8 HOURS AS  NEEDED FOR PAIN    Refill:  0  . Calcium Carb-Cholecalciferol (LIQUID CALCIUM WITH D3 PO)    Sig: Take by mouth.  . cyanocobalamin ((VITAMIN B-12)) injection 1,000 mcg    Sig:      Follow-up: Return in about 4 weeks (around 12/21/2014).  Scarlette Calico, MD

## 2014-11-23 NOTE — Progress Notes (Signed)
Pre visit review using our clinic review tool, if applicable. No additional management support is needed unless otherwise documented below in the visit note. 

## 2014-11-28 ENCOUNTER — Encounter: Payer: Medicare Other | Attending: Surgery

## 2014-11-28 DIAGNOSIS — E669 Obesity, unspecified: Secondary | ICD-10-CM | POA: Insufficient documentation

## 2014-11-28 DIAGNOSIS — Z713 Dietary counseling and surveillance: Secondary | ICD-10-CM | POA: Insufficient documentation

## 2014-11-28 DIAGNOSIS — Z6839 Body mass index (BMI) 39.0-39.9, adult: Secondary | ICD-10-CM | POA: Diagnosis not present

## 2014-11-28 DIAGNOSIS — Z01818 Encounter for other preprocedural examination: Secondary | ICD-10-CM | POA: Insufficient documentation

## 2014-11-29 NOTE — Progress Notes (Addendum)
Bariatric Class:  Appt start time: 1530 end time:  1630.  2 Week Post-Operative Nutrition Class  Patient was seen on 11/28/2014 for Post-Operative Nutrition education at [facility].   Surgery date: 11/13/2014 Surgery type: RYGB Start weight at [facility]: 216 lbs on 05/11/14 Weight today: 203.5 lbs  Weight change: 16.5 lbs  TANITA  BODY COMP RESULTS  10/09/14 11/28/14   BMI (kg/m^2) 39 36.1   Fat Mass (lbs) 117.5 102.0   Fat Free Mass (lbs) 102.5 101.5   Total Body Water (lbs) 75 74.5    The following the learning objectives were met by the patient during this course:  Identifies Phase 3A (Soft, High Proteins) Dietary Goals and will begin from 2 weeks post-operatively to 2 months post-operatively  Identifies appropriate sources of fluids and proteins   States protein recommendations and appropriate sources post-operatively  Identifies the need for appropriate texture modifications, mastication, and bite sizes when consuming solids  Identifies appropriate multivitamin and calcium sources post-operatively  Describes the need for physical activity post-operatively and will follow MD recommendations  States when to call healthcare provider regarding medication questions or post-operative complications  Handouts given during class include:  Phase 3A: Soft, High Protein Diet Handout  Follow-Up Plan: Patient will follow-up at [facility] in 6 weeks for 2 month post-op nutrition visit for diet advancement per MD.

## 2014-12-08 ENCOUNTER — Inpatient Hospital Stay (HOSPITAL_COMMUNITY): Admission: RE | Admit: 2014-12-08 | Payer: Medicare Other | Source: Ambulatory Visit | Admitting: Specialist

## 2014-12-08 ENCOUNTER — Encounter (HOSPITAL_COMMUNITY): Admission: RE | Payer: Self-pay | Source: Ambulatory Visit

## 2014-12-08 SURGERY — ARTHROPLASTY, KNEE, TOTAL
Anesthesia: Spinal | Site: Knee | Laterality: Right

## 2014-12-26 ENCOUNTER — Ambulatory Visit (INDEPENDENT_AMBULATORY_CARE_PROVIDER_SITE_OTHER): Payer: Medicare Other

## 2014-12-26 ENCOUNTER — Telehealth: Payer: Self-pay

## 2014-12-26 VITALS — BP 158/82

## 2014-12-26 DIAGNOSIS — E538 Deficiency of other specified B group vitamins: Secondary | ICD-10-CM | POA: Diagnosis not present

## 2014-12-26 DIAGNOSIS — Z23 Encounter for immunization: Secondary | ICD-10-CM

## 2014-12-26 MED ORDER — CYANOCOBALAMIN 1000 MCG/ML IJ SOLN
1000.0000 ug | Freq: Once | INTRAMUSCULAR | Status: AC
  Administered 2014-12-26: 1000 ug via SUBCUTANEOUS

## 2014-12-26 NOTE — Telephone Encounter (Signed)
Pt came in today for nurse visit. States she was told to check her BP as well, reading today was 158/82. Pt states she was taken off bp meds bc of controlled readings. Had recent surgery. Wanted to know should she start back on meds.I consulted with Dr. Sharlet Salina, Dr. Ronnald Ramp is out of office. Dr. Sharlet Salina said to not start up bp meds yet can f/u with Dr. Ronnald Ramp in next month. Informed pt to monitor bp readings for the next two weeks and will return on 11/7 for b12 injection and bp consult. Will call office with further problems if necessary.

## 2015-01-03 ENCOUNTER — Other Ambulatory Visit: Payer: Self-pay | Admitting: Orthopedic Surgery

## 2015-01-09 ENCOUNTER — Encounter: Payer: Medicare Other | Attending: Surgery | Admitting: Dietician

## 2015-01-09 ENCOUNTER — Encounter: Payer: Self-pay | Admitting: Dietician

## 2015-01-09 DIAGNOSIS — Z01818 Encounter for other preprocedural examination: Secondary | ICD-10-CM | POA: Insufficient documentation

## 2015-01-09 DIAGNOSIS — Z6839 Body mass index (BMI) 39.0-39.9, adult: Secondary | ICD-10-CM | POA: Diagnosis not present

## 2015-01-09 DIAGNOSIS — Z713 Dietary counseling and surveillance: Secondary | ICD-10-CM | POA: Diagnosis not present

## 2015-01-09 DIAGNOSIS — E669 Obesity, unspecified: Secondary | ICD-10-CM | POA: Diagnosis not present

## 2015-01-09 NOTE — Progress Notes (Signed)
  Follow-up visit:  8 Weeks Post-Operative RYGB Surgery  Medical Nutrition Therapy:  Appt start time: 4970 end time:  0915.  Primary concerns today: Post-operative Bariatric Surgery Nutrition Management. Returns with a 12 lbs weight loss. Feeling good overall. Tolerating most foods though stomach will hurt if she eats too much. Can tolerate Skinny Cow ice cream (had 2 x).   Surgery date: 11/13/2014 Surgery type: RYGB Start weight at [facility]: 216 lbs on 05/11/14 Weight today: 191.5 lbs  Weight change: 12 lbs Total weight loss: 24.5 lbs  TANITA  BODY COMP RESULTS  10/09/14 11/28/14 01/09/15   BMI (kg/m^2) 39 36.1 33.9   Fat Mass (lbs) 117.5 102.0 92.0   Fat Free Mass (lbs) 102.5 101.5 99.5   Total Body Water (lbs) 75 74.5 73.0     Preferred Learning Style:   No preference indicated   Learning Readiness:   Ready  24-hr recall: B (AM): Premier Protein (30 g) Snk (AM): none or yogurt (0-12 g) L (PM): on the road - 1/2 grilled chicken or 3-4 bites hamburger without bun (7-14 g) Snk (PM): none or cashews/pecans or SF jello or yogurt (0-12 g) D (PM): 2 oz pork tenderloin with green beans (14 g) Snk (PM): none  Fluid intake: 11 oz protein shake, 51-68 oz water, 24 oz Twist (when she can find it) Estimated total protein intake: 51-82 g  Medications: see list  Supplementation: taking  Using straws: sometimes  Drinking while eating: tries not to Hair loss: a little  Carbonated beverages: No N/V/D/C: diarrhea once in a while  Dumping syndrome: No  Recent physical activity:  Not going to the gym, but preparing to sell a house  Progress Towards Goal(s):  In progress.  Handouts given during visit include:  Phase 3B + Non Starchy Vegetables   Nutritional Diagnosis:  Sutton-3.3 Overweight/obesity related to past poor dietary habits and physical inactivity as evidenced by patient w/ recent RYGB surgery following dietary guidelines for continued weight loss.    Intervention:  Nutrition  education/diet advancement. Goals:  Follow Phase 3B: High Protein + Non-Starchy Vegetables  Eat 3-6 small meals/snacks, every 3-5 hrs  Increase lean protein foods to meet 60g goal  Try having a protein for snacks if you are hungry (see list)  Increase fluid intake to 64oz +  Avoid drinking 15 minutes before, during and 30 minutes after eating  Aim for >30 min of physical activity daily  Plan to go to the gym 2-3 x week  once house is on the market for   Teaching Method Utilized: none Visual Auditory Hands on  Barriers to learning/adherence to lifestyle change: none  Demonstrated degree of understanding via:  Teach Back   Monitoring/Evaluation:  Dietary intake, exercise, and body weight. Follow up in 1 months for 3 month post-op visit.

## 2015-01-09 NOTE — Patient Instructions (Addendum)
Goals:  Follow Phase 3B: High Protein + Non-Starchy Vegetables  Eat 3-6 small meals/snacks, every 3-5 hrs  Increase lean protein foods to meet 60g goal  Try having a protein for snacks if you are hungry (see list)  Increase fluid intake to 64oz +  Avoid drinking 15 minutes before, during and 30 minutes after eating  Aim for >30 min of physical activity daily  Plan to go to the gym 2-3 x week  once house is on the market for   Surgery date: 11/13/2014 Surgery type: RYGB Start weight at [facility]: 216 lbs on 05/11/14 Weight today: 191.5 lbs  Weight change: 12 lbs Total weight loss: 24.5 lbs  TANITA  BODY COMP RESULTS  10/09/14 11/28/14 01/09/15   BMI (kg/m^2) 39 36.1 33.9   Fat Mass (lbs) 117.5 102.0 92.0   Fat Free Mass (lbs) 102.5 101.5 99.5   Total Body Water (lbs) 75 74.5 73.0

## 2015-01-29 ENCOUNTER — Ambulatory Visit: Payer: Self-pay | Admitting: Internal Medicine

## 2015-01-29 ENCOUNTER — Encounter: Payer: Self-pay | Admitting: Internal Medicine

## 2015-01-29 ENCOUNTER — Ambulatory Visit (INDEPENDENT_AMBULATORY_CARE_PROVIDER_SITE_OTHER): Payer: Medicare Other | Admitting: Internal Medicine

## 2015-01-29 VITALS — BP 138/88 | HR 91 | Temp 97.7°F | Resp 16 | Ht 63.0 in | Wt 188.0 lb

## 2015-01-29 DIAGNOSIS — I10 Essential (primary) hypertension: Secondary | ICD-10-CM

## 2015-01-29 DIAGNOSIS — E538 Deficiency of other specified B group vitamins: Secondary | ICD-10-CM

## 2015-01-29 MED ORDER — CYANOCOBALAMIN 1000 MCG/ML IJ SOLN
1000.0000 ug | Freq: Once | INTRAMUSCULAR | Status: AC
  Administered 2015-01-29: 1000 ug via SUBCUTANEOUS

## 2015-01-29 NOTE — Progress Notes (Signed)
Subjective:  Patient ID: Shawna Ellison, female    DOB: [birth year]  Age: 69 y.o. MRN: [id number]  CC: Hypertension   HPI Shawna Ellison presents for a BP check - her BP at home has been normal. She feels well and offers no complaints.  Outpatient Prescriptions Prior to Visit  Medication Sig Dispense Refill  . acetaminophen (TYLENOL) 500 MG tablet Take 500 mg by mouth every 6 (six) hours as needed for moderate pain or headache.    . Calcium Carb-Cholecalciferol (LIQUID CALCIUM WITH D3 PO) Take by mouth.    . Cholecalciferol 2000 UNITS TABS Take 1 tablet (2,000 Units total) by mouth daily. 90 tablet 3  . cyanocobalamin 2000 MCG tablet Take 1 tablet (2,000 mcg total) by mouth daily. 90 tablet 3  . Nutritional Supplements (PEPTAMEN BARIATRIC PO) Take 1 tablet by mouth daily.    Marland Kitchen oxyCODONE (ROXICODONE) 5 MG/5ML solution TAKE 5-10 MLS BY MOUTH EVERY 8 HOURS AS NEEDED FOR PAIN  0  . thiamine (VITAMIN B-1) 100 MG tablet Take 1 tablet (100 mg total) by mouth daily. 90 tablet 3  . DULoxetine (CYMBALTA) 20 MG capsule Take 1 capsule (20 mg total) by mouth 2 (two) times daily. 180 capsule 3  . fluoruracil (FLUOROPLEX) 1 % cream Apply topically 2 (two) times daily.     No facility-administered medications prior to visit.    ROS Review of Systems  Constitutional: Negative.  Negative for fever, chills, diaphoresis, appetite change and fatigue.  HENT: Negative.   Eyes: Negative.   Respiratory: Negative.  Negative for cough, choking, chest tightness, shortness of breath and stridor.   Cardiovascular: Negative.  Negative for chest pain, palpitations and leg swelling.  Gastrointestinal: Negative.  Negative for nausea, vomiting, abdominal pain, diarrhea, constipation and blood in stool.  Endocrine: Negative.   Genitourinary: Negative.  Negative for difficulty urinating.  Musculoskeletal: Negative.  Negative for back pain, joint swelling and arthralgias.  Skin: Negative.   Allergic/Immunologic:  Negative.   Neurological: Negative.  Negative for dizziness, tremors, syncope, light-headedness and numbness.  Hematological: Negative.  Negative for adenopathy. Does not bruise/bleed easily.  Psychiatric/Behavioral: Negative.     Objective:  BP 138/88 mmHg  Pulse 91  Temp(Src) 97.7 F (36.5 C) (Oral)  Resp 16  Ht 5\' 3"  (1.6 m)  Wt 188 lb (85.276 kg)  BMI 33.31 kg/m2  SpO2 95%  BP Readings from Last 3 Encounters:  01/29/15 138/88  12/26/14 158/82  11/23/14 120/78    Wt Readings from Last 3 Encounters:  01/29/15 188 lb (85.276 kg)  01/09/15 191 lb 8 oz (86.864 kg)  11/29/14 203 lb 8 oz (92.307 kg)    Physical Exam  Constitutional: She is oriented to person, place, and time. No distress.  HENT:  Head: Normocephalic and atraumatic.  Mouth/Throat: Oropharynx is clear and moist. No oropharyngeal exudate.  Eyes: Conjunctivae are normal. Right eye exhibits no discharge. Left eye exhibits no discharge. No scleral icterus.  Neck: Normal range of motion. Neck supple. No JVD present. No tracheal deviation present. No thyromegaly present.  Cardiovascular: Normal rate, regular rhythm, normal heart sounds and intact distal pulses.  Exam reveals no gallop and no friction rub.   No murmur heard. Pulmonary/Chest: Effort normal and breath sounds normal. No stridor. No respiratory distress. She has no wheezes. She has no rales. She exhibits no tenderness.  Abdominal: Soft. Bowel sounds are normal. She exhibits no distension and no mass. There is no tenderness. There is no rebound and no  guarding.  Musculoskeletal: She exhibits no edema or tenderness.  Lymphadenopathy:    She has no cervical adenopathy.  Neurological: She is oriented to person, place, and time.  Skin: Skin is warm and dry. No rash noted. She is not diaphoretic. No erythema. No pallor.  Vitals reviewed.   Lab Results  Component Value Date   WBC 6.8 11/15/2014   HGB 11.4* 11/15/2014   HCT 37.2 11/15/2014   PLT 219  11/15/2014   GLUCOSE 98 11/09/2014   CHOL 203* 03/09/2014   TRIG 103.0 03/09/2014   HDL 63.40 03/09/2014   LDLCALC 119* 03/09/2014   ALT 27 11/09/2014   AST 24 11/09/2014   NA 139 11/09/2014   K 3.8 11/09/2014   CL 103 11/09/2014   CREATININE 0.72 11/09/2014   BUN 14 11/09/2014   CO2 29 11/09/2014   TSH 1.08 03/09/2014   HGBA1C 6.2 03/09/2014    No results found.  Assessment & Plan:   Kamree was seen today for hypertension.  Diagnoses and all orders for this visit:  Vitamin B12 deficiency (non anemic) -     cyanocobalamin ((VITAMIN B-12)) injection 1,000 mcg; Inject 1 mL (1,000 mcg total) into the skin once.  Essential hypertension, benign- her BP is well controlled, no meds are needed, she will cont to work on her lifestyle modifications   I have discontinued Ms. Slaugh's DULoxetine, fluoruracil, and HYDROcodone-acetaminophen. I am also having her maintain her thiamine, Cholecalciferol, cyanocobalamin, acetaminophen, Nutritional Supplements (PEPTAMEN BARIATRIC PO), oxyCODONE, and Calcium Carb-Cholecalciferol (LIQUID CALCIUM WITH D3 PO). We administered cyanocobalamin.  Meds ordered this encounter  Medications  . DISCONTD: HYDROcodone-acetaminophen (NORCO/VICODIN) 5-325 MG tablet    Sig:   . cyanocobalamin ((VITAMIN B-12)) injection 1,000 mcg    Sig:      Follow-up: Return in about 6 months (around 07/29/2015).  Scarlette Calico, MD

## 2015-01-29 NOTE — Patient Instructions (Signed)
Hypertension Hypertension, commonly called high blood pressure, is when the force of blood pumping through your arteries is too strong. Your arteries are the blood vessels that carry blood from your heart throughout your body. A blood pressure reading consists of a higher number over a lower number, such as 110/72. The higher number (systolic) is the pressure inside your arteries when your heart pumps. The lower number (diastolic) is the pressure inside your arteries when your heart relaxes. Ideally you want your blood pressure below 120/80. Hypertension forces your heart to work harder to pump blood. Your arteries may become narrow or stiff. Having untreated or uncontrolled hypertension can cause heart attack, stroke, kidney disease, and other problems. RISK FACTORS Some risk factors for high blood pressure are controllable. Others are not.  Risk factors you cannot control include:   Race. You may be at higher risk if you are African American.  Age. Risk increases with age.  Gender. Men are at higher risk than women before age 45 years. After age 65, women are at higher risk than men. Risk factors you can control include:  Not getting enough exercise or physical activity.  Being overweight.  Getting too much fat, sugar, calories, or salt in your diet.  Drinking too much alcohol. SIGNS AND SYMPTOMS Hypertension does not usually cause signs or symptoms. Extremely high blood pressure (hypertensive crisis) may cause headache, anxiety, shortness of breath, and nosebleed. DIAGNOSIS To check if you have hypertension, your health care provider will measure your blood pressure while you are seated, with your arm held at the level of your heart. It should be measured at least twice using the same arm. Certain conditions can cause a difference in blood pressure between your right and left arms. A blood pressure reading that is higher than normal on one occasion does not mean that you need treatment. If  it is not clear whether you have high blood pressure, you may be asked to return on a different day to have your blood pressure checked again. Or, you may be asked to monitor your blood pressure at home for 1 or more weeks. TREATMENT Treating high blood pressure includes making lifestyle changes and possibly taking medicine. Living a healthy lifestyle can help lower high blood pressure. You may need to change some of your habits. Lifestyle changes may include:  Following the DASH diet. This diet is high in fruits, vegetables, and whole grains. It is low in salt, red meat, and added sugars.  Keep your sodium intake below 2,300 mg per day.  Getting at least 30-45 minutes of aerobic exercise at least 4 times per week.  Losing weight if necessary.  Not smoking.  Limiting alcoholic beverages.  Learning ways to reduce stress. Your health care provider may prescribe medicine if lifestyle changes are not enough to get your blood pressure under control, and if one of the following is true:  You are 18-59 years of age and your systolic blood pressure is above 140.  You are 60 years of age or older, and your systolic blood pressure is above 150.  Your diastolic blood pressure is above 90.  You have diabetes, and your systolic blood pressure is over 140 or your diastolic blood pressure is over 90.  You have kidney disease and your blood pressure is above 140/90.  You have heart disease and your blood pressure is above 140/90. Your personal target blood pressure may vary depending on your medical conditions, your age, and other factors. HOME CARE INSTRUCTIONS    Have your blood pressure rechecked as directed by your health care provider.   Take medicines only as directed by your health care provider. Follow the directions carefully. Blood pressure medicines must be taken as prescribed. The medicine does not work as well when you skip doses. Skipping doses also puts you at risk for  problems.  Do not smoke.   Monitor your blood pressure at home as directed by your health care provider. SEEK MEDICAL CARE IF:   You think you are having a reaction to medicines taken.  You have recurrent headaches or feel dizzy.  You have swelling in your ankles.  You have trouble with your vision. SEEK IMMEDIATE MEDICAL CARE IF:  You develop a severe headache or confusion.  You have unusual weakness, numbness, or feel faint.  You have severe chest or abdominal pain.  You vomit repeatedly.  You have trouble breathing. MAKE SURE YOU:   Understand these instructions.  Will watch your condition.  Will get help right away if you are not doing well or get worse.   This information is not intended to replace advice given to you by your health care provider. Make sure you discuss any questions you have with your health care provider.   Document Released: 03/10/2005 Document Revised: 07/25/2014 Document Reviewed: 12/31/2012 Elsevier Interactive Patient Education 2016 [employer]  

## 2015-01-29 NOTE — Progress Notes (Signed)
Pre visit review using our clinic review tool, if applicable. No additional management support is needed unless otherwise documented below in the visit note. 

## 2015-02-05 ENCOUNTER — Encounter: Payer: Self-pay | Admitting: Dietician

## 2015-02-05 ENCOUNTER — Encounter: Payer: Medicare Other | Attending: Surgery | Admitting: Dietician

## 2015-02-05 DIAGNOSIS — Z713 Dietary counseling and surveillance: Secondary | ICD-10-CM | POA: Diagnosis not present

## 2015-02-05 DIAGNOSIS — E669 Obesity, unspecified: Secondary | ICD-10-CM | POA: Insufficient documentation

## 2015-02-05 DIAGNOSIS — Z6839 Body mass index (BMI) 39.0-39.9, adult: Secondary | ICD-10-CM | POA: Insufficient documentation

## 2015-02-05 DIAGNOSIS — Z01818 Encounter for other preprocedural examination: Secondary | ICD-10-CM | POA: Diagnosis not present

## 2015-02-05 NOTE — H&P (Signed)
TOTAL KNEE ADMISSION H&P  Patient is being admitted for right total knee arthroplasty.  Subjective:  Chief Complaint:right knee pain.  HPI: Shawna Ellison, 69 y.o. female, has a history of pain and functional disability in the right knee due to arthritis and has failed non-surgical conservative treatments for greater than 12 weeks to includeNSAID's and/or analgesics, corticosteriod injections, viscosupplementation injections, flexibility and strengthening excercises, weight reduction as appropriate and activity modification.  Onset of symptoms was gradual, starting 3 years ago with gradually worsening course since that time. The patient noted prior procedures on the knee to include  arthroscopy on the right knee(s).  Patient currently rates pain in the right knee(s) at 9 out of 10 with activity. Patient has worsening of pain with activity and weight bearing, pain that interferes with activities of daily living and pain with passive range of motion.  Patient has evidence of subchondral sclerosis, periarticular osteophytes and joint space narrowing by imaging studies. This patient has had osteoarthritis. There is no active infection.  Patient Active Problem List   Diagnosis Date Noted  . Morbid obesity ([place]) 11/13/2014  . Estrogen deficiency 06/10/2014  . Vitamin B12 deficiency (non anemic) 05/10/2014  . 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 Hyperlipidemia with target LDL less than 130 03/09/2014  . Baker's cyst of knee 04/01/2013  . Vitamin D insufficiency 03/11/2013  . Hyperglycemia 03/10/2013  . SUI (stress urinary incontinence, female) 03/10/2013  . Other screening mammogram 03/10/2013  . Osteopenia 03/10/2013  . Routine general medical examination at a health care facility 03/10/2013  . Obesity (BMI 35.0-39.9 without comorbidity) ([place]) 03/12/2012  . Bariatric surgery status 03/12/2012  . Essential hypertension, benign 03/12/2012   Past Medical History  Diagnosis Date  .  Arthritis   . Hypertension   . Migraine     hx of  . Complication of anesthesia     slow to awaken after some surgeries    Past Surgical History  Procedure Laterality Date  . Tubal ligation  1975  . Lapband surgery    . Carpal tunnel release Bilateral   . Cyst removed from right knee    . [place] removed from right knee    . Cyst removed from right wrist    . Lateral release on left wrist    . Right thumb trigrrrer finger release    . 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 Smoking status: Never Smoker   . Smokeless tobacco: Never Used  . Alcohol Use: No    Family History  Problem Relation Age of Onset  . Arthritis Other   . Hyperlipidemia Other   . Hypertension Other   . Diabetes Other   . Cancer Other     overian and breast  . Stroke Neg Hx   . Kidney disease Neg Hx   . Heart disease Neg Hx   . Early death Neg Hx   . Drug abuse Neg Hx   . Alcohol abuse Neg Hx   . COPD Father      Review of Systems  Constitutional: Negative.   HENT: Negative.   Eyes: Negative.   Respiratory: Negative.   Cardiovascular: Negative.   Gastrointestinal: Negative.   Genitourinary: Negative.   Musculoskeletal: Positive for joint pain.  Skin: Negative.  Neurological: Negative.   Endo/Heme/Allergies: Negative.   Psychiatric/Behavioral: Negative.     Objective:  Physical Exam  Constitutional: She is oriented to person, place, and time. She appears well-developed.  HENT:  Head: Atraumatic.  Eyes: EOM are normal.  Neck: Normal range of motion.  Cardiovascular: Normal rate, normal heart sounds and intact distal pulses.   Respiratory: Effort normal and breath sounds normal.  GI: Soft. Bowel sounds are normal.  Musculoskeletal:  Right knee pain with  rom. RLE grossly n/v intact.  Neurological: She is alert and oriented to person, place, and time. She has normal reflexes.  Skin: Skin is warm and dry.  Psychiatric: Her behavior is normal.    Vital signs in last 24 hours: Weight:  [83.915 kg (185 lb)] 83.915 kg (185 lb) (11/14 0835)  Labs:   Estimated body mass index is 36.06 kg/(m^2) as calculated from the following:   Height as of 11/29/14: 5\' 3"  (1.6 m).   Weight as of 11/28/14: 92.307 kg (203 lb 8 oz).   Imaging Review Plain radiographs demonstrate moderate degenerative joint disease of the right knee(s). The overall alignment ismild varus. The bone quality appears to be good for age and reported activity level.  Assessment/Plan:  End stage arthritis, right knee   The patient history, physical examination, clinical judgment of the provider and imaging studies are consistent with end stage degenerative joint disease of the right knee(s) and total knee arthroplasty is deemed medically necessary. The treatment options including medical management, injection therapy arthroscopy and arthroplasty were discussed at length. The risks and benefits of total knee arthroplasty were presented and reviewed. The risks due to aseptic loosening, infection, stiffness, patella tracking problems, thromboembolic complications and other imponderables were discussed. The patient acknowledged the explanation, agreed to proceed with the plan and consent was signed. Patient is being admitted for inpatient treatment for surgery, pain control, PT, OT, prophylactic antibiotics, VTE prophylaxis, progressive ambulation and ADL's and discharge planning. The patient is planning to be discharged home with home health services.  Contraindications and adverse affects of Tranexamic acid discussed in detail. Patient denies any of these at this time and understands the risks and benefits.

## 2015-02-05 NOTE — Patient Instructions (Addendum)
Goals:  Follow Phase 3B: High Protein + Non-Starchy Vegetables  Eat 3-6 small meals/snacks, every 3-5 hrs  Increase lean protein foods to meet 60g goal   Have a protein shake one time per day  Try having a protein for snacks if you are hungry (see list)  Increase fluid intake to 64oz +  Avoid drinking 15 minutes before, during and 30 minutes after eating  Aim for >30 min of physical activity daily  Plan to go to the gym or use total gym 2-3 x week once you've healed from knee surgery  Try to eat protein and vegetables at meals, limit carbs (spaghetti, corn, peas)   Surgery date: 11/13/2014 Surgery type: RYGB Start weight at [facility]: 216 lbs on 05/11/14 Weight today: 185.0 lbs   Weight change: 6.5 lbs Total weight loss: 31 lbs  TANITA  BODY COMP RESULTS  10/09/14 11/28/14 01/09/15 02/05/15   BMI (kg/m^2) 39 36.1 33.9 32.8   Fat Mass (lbs) 117.5 102.0 92.0 87.0   Fat Free Mass (lbs) 102.5 101.5 99.5 98.0   Total Body Water (lbs) 75 74.5 73.0 71.5

## 2015-02-05 NOTE — Progress Notes (Signed)
  Follow-up visit:  12 Weeks Post-Operative RYGB Surgery  Medical Nutrition Therapy:  Appt start time: [id number] end time:  0905.  Primary concerns today: Post-operative Bariatric Surgery Nutrition Management. Returns with a 6.5 lbs weight loss. Feeling good overall. Has sometimes when she doesn't feel well but it passes quickly. Can tolerate most foods.   Does not have any more ice cream.   Surgery date: 11/13/2014 Surgery type: RYGB Start weight at [facility]: 216 lbs on 05/11/14 Weight today: 185.0 lbs   Weight change: 6.5 lbs Total weight loss: 31 lbs  TANITA  BODY COMP RESULTS  10/09/14 11/28/14 01/09/15 02/05/15   BMI (kg/m^2) 39 36.1 33.9 32.8   Fat Mass (lbs) 117.5 102.0 92.0 87.0   Fat Free Mass (lbs) 102.5 101.5 99.5 98.0   Total Body Water (lbs) 75 74.5 73.0 71.5     Preferred Learning Style:   No preference indicated   Learning Readiness:   Ready  24-hr recall: B (AM): Premier Protein or omelet with cheese/boiled egg (6-30 g) Snk (AM): none or yogurt (0-12 g) L (PM): egg salad or salad with cheese, sometimes with chicken (7-14 g) Snk (PM): none or cashews/pecans or SF jello or yogurt or jerky (0-12 g) D (PM): 2-3 oz pork tenderloin or chicken with green beans, sometimes spaghetti with hamburger (7-21 g) Snk (PM): none  Fluid intake: 11 oz protein shake  Less than 1 x week, 51-68 oz water, 24 oz Twist  Estimated total protein intake: 51-82 g  Medications: see list  Supplementation: taking  Using straws: sometimes  Drinking while eating: tries not to Hair loss: can't tell  Carbonated beverages: might have a sip N/V/D/C: diarrhea once in a while  Dumping syndrome: once with brownie and ice cream   Recent physical activity:  Not going to the gym, but preparing to sell a house  Progress Towards Goal(s):  In progress.  Handouts given during visit include:  none   Nutritional Diagnosis:  [place]-3.3 Overweight/obesity related to past poor dietary habits and physical  inactivity as evidenced by patient w/ recent RYGB surgery following dietary guidelines for continued weight loss.    Intervention:  Nutrition education/diet advancement. Goals:  Follow Phase 3B: High Protein + Non-Starchy Vegetables  Eat 3-6 small meals/snacks, every 3-5 hrs  Increase lean protein foods to meet 60g goal  Try having a protein for snacks if you are hungry (see list)  Increase fluid intake to 64oz +  Avoid drinking 15 minutes before, during and 30 minutes after eating  Aim for >30 min of physical activity daily  Plan to go to the gym 2-3 x week  once house is on the market for   Teaching Method Utilized: none Visual Auditory Hands on  Barriers to learning/adherence to lifestyle change: none  Demonstrated degree of understanding via:  Teach Back   Monitoring/Evaluation:  Dietary intake, exercise, and body weight. Follow up in 2 months for 5 month post-op visit.

## 2015-02-12 NOTE — Progress Notes (Signed)
04/13/2014-PRE-OPERATIVE CLEARANCE FROM DR. CHRIS NEWMAN ON CHART. 09/11/2014-PRE-OPERATIVE CLEARANCE FROM DR. Ronnald Ramp ON CHART. 11/09/2014-NOTED EKG IN EPIC.

## 2015-02-12 NOTE — Patient Instructions (Addendum)
Shawna Ellison  02/12/2015   Your procedure is scheduled on: Friday 02/23/2015  Report to [facility] Main  Entrance take [facility]  elevators to 3rd floor to  [place] at 1100 AM.  Call this number if you have problems the morning of surgery [phone]   Remember: ONLY 1 PERSON MAY GO WITH YOU TO SHORT STAY TO GET  READY MORNING OF [place].   CLEAR LIQUID DIET   Foods Allowed                                                                     Foods Excluded  Coffee and tea, regular and decaf                             liquids that you cannot  Plain Jell-O in any flavor                                             see through such as: Fruit ices (not with fruit pulp)                                     milk, soups, orange juice  Iced Popsicles                                    All solid food Carbonated beverages, regular and diet                                    Cranberry, grape and apple juices Sports drinks like Gatorade Lightly seasoned clear broth or consume(fat free) Sugar, honey syrup  Sample Menu Breakfast                                Lunch                                     Supper Cranberry juice                    Beef broth                            Chicken broth Jell-O                                     Grape juice                           Apple juice Coffee or tea  Jell-O                                      Popsicle                                                Coffee or tea                        Coffee or tea  _____________________________________________________________________    Do not eat food  :After Midnight.MAY HAVE CLEAR LIQUIDS FROM MIDNIGHT UP UNTIL 0700 AM THEN NOTHING UNTIL AFTER SURGERY!     Take these medicines the morning of surgery with A SIP OF WATER: HYDROCODONE-ACETAMINOPHEN IF NEEDED               DO NOT TAKE ANY DIABETIC MEDICATIONS DAY OF YOUR SURGERY                                You may not have any metal on your body including hair pins and              piercings  Do not wear jewelry, make-up, lotions, powders or perfumes, deodorant             Do not wear nail polish.  Do not shave  48 hours prior to surgery.              Men may shave face and neck.   Do not bring valuables to the hospital. [place].  Contacts, dentures or bridgework may not be worn into surgery.  Leave suitcase in the car. After surgery it may be brought to your room.     Patients discharged the day of surgery will not be allowed to drive home.  Name and phone number of your driver:  Special Instructions: N/A              Please read over the following fact sheets you were given: _____________________________________________________________________             [facility] - Preparing for Surgery Before surgery, you can play an important role.  Because skin is not sterile, your skin needs to be as free of germs as possible.  You can reduce the number of germs on your skin by washing with CHG (chlorahexidine gluconate) soap before surgery.  CHG is an antiseptic cleaner which kills germs and bonds with the skin to continue killing germs even after washing. Please DO NOT use if you have an allergy to CHG or antibacterial soaps.  If your skin becomes reddened/irritated stop using the CHG and inform your nurse when you arrive at Short Stay. Do not shave (including legs and underarms) for at least 48 hours prior to the first CHG shower.  You may shave your face/neck. Please follow these instructions carefully:  1.  Shower with CHG Soap the night before surgery and the  morning of Surgery.  2.  If you choose to wash your hair, wash your hair first as usual with your  normal  shampoo.  3.  After you shampoo,  rinse your hair and body thoroughly to remove the  shampoo.                           4.  Use CHG as you would any other liquid soap.   You can apply chg directly  to the skin and wash                       Gently with a scrungie or clean washcloth.  5.  Apply the CHG Soap to your body ONLY FROM THE NECK DOWN.   Do not use on face/ open                           Wound or open sores. Avoid contact with eyes, ears mouth and genitals (private parts).                       Wash face,  Genitals (private parts) with your normal soap.             6.  Wash thoroughly, paying special attention to the area where your surgery  will be performed.  7.  Thoroughly rinse your body with warm water from the neck down.  8.  DO NOT shower/wash with your normal soap after using and rinsing off  the CHG Soap.                9.  Pat yourself dry with a clean towel.            10.  Wear clean pajamas.            11.  Place clean sheets on your bed the night of your first shower and do not  sleep with pets. Day of Surgery : Do not apply any lotions/deodorants the morning of surgery.  Please wear clean clothes to the hospital/surgery center.  FAILURE TO FOLLOW THESE INSTRUCTIONS MAY RESULT IN THE CANCELLATION OF YOUR SURGERY PATIENT SIGNATURE_________________________________  NURSE SIGNATURE__________________________________  ________________________________________________________________________  Adam Phenix  An incentive spirometer is a tool that can help keep your lungs clear and active. This tool measures how well you are filling your lungs with each breath. Taking long deep breaths may help reverse or decrease the chance of developing breathing (pulmonary) problems (especially infection) following:  A long period of time when you are unable to move or be active. BEFORE THE PROCEDURE   If the spirometer includes an indicator to show your best effort, your nurse or respiratory therapist will set it to a desired goal.  If possible, sit up straight or lean slightly forward. Try not to slouch.  Hold the incentive spirometer in an  upright position. INSTRUCTIONS FOR USE   Sit on the edge of your bed if possible, or sit up as far as you can in bed or on a chair.  Hold the incentive spirometer in an upright position.  Breathe out normally.  Place the mouthpiece in your mouth and seal your lips tightly around it.  Breathe in slowly and as deeply as possible, raising the piston or the ball toward the top of the column.  Hold your breath for 3-5 seconds or for as long as possible. Allow the piston or ball to fall to the bottom of the column.  Remove the mouthpiece from your mouth and breathe out normally.  Rest for a few seconds and  repeat Steps 1 through 7 at least 10 times every 1-2 hours when you are awake. Take your time and take a few normal breaths between deep breaths.  The spirometer may include an indicator to show your best effort. Use the indicator as a goal to work toward during each repetition.  After each set of 10 deep breaths, practice coughing to be sure your lungs are clear. If you have an incision (the cut made at the time of surgery), support your incision when coughing by placing a pillow or rolled up towels firmly against it. Once you are able to get out of bed, walk around indoors and cough well. You may stop using the incentive spirometer when instructed by your caregiver.  RISKS AND COMPLICATIONS  Take your time so you do not get dizzy or light-headed.  If you are in pain, you may need to take or ask for pain medication before doing incentive spirometry. It is harder to take a deep breath if you are having pain. AFTER USE  Rest and breathe slowly and easily.  It can be helpful to keep track of a log of your progress. Your caregiver can provide you with a simple table to help with this. If you are using the spirometer at home, follow these instructions: [place] IF:   You are having difficultly using the spirometer.  You have trouble using the spirometer as often as  instructed.  Your pain medication is not giving enough relief while using the spirometer.  You develop fever of 100.5 F (38.1 C) or higher. SEEK IMMEDIATE MEDICAL CARE IF:   You cough up bloody sputum that had not been present before.  You develop fever of 102 F (38.9 C) or greater.  You develop worsening pain at or near the incision site. MAKE SURE YOU:   Understand these instructions.  Will watch your condition.  Will get help right away if you are not doing well or get worse. Document Released: 07/21/2006 Document Revised: 06/02/2011 Document Reviewed: 09/21/2006 ExitCare Patient Information 2014 ExitCare, [place].   ________________________________________________________________________  WHAT IS A BLOOD TRANSFUSION? Blood Transfusion Information  A transfusion is the replacement of blood or some of its parts. Blood is made up of multiple cells which provide different functions.  Red blood cells carry oxygen and are used for blood loss replacement.  White blood cells fight against infection.  Platelets control bleeding.  Plasma helps clot blood.  Other blood products are available for specialized needs, such as hemophilia or other clotting disorders. BEFORE THE TRANSFUSION  Who gives blood for transfusions?   Healthy volunteers who are fully evaluated to make sure their blood is safe. This is blood bank blood. Transfusion therapy is the safest it has ever been in the practice of medicine. Before blood is taken from a donor, a complete history is taken to make sure that person has no history of diseases nor engages in risky social behavior (examples are intravenous drug use or sexual activity with multiple partners). The donor's travel history is screened to minimize risk of transmitting infections, such as malaria. The donated blood is tested for signs of infectious diseases, such as HIV and hepatitis. The blood is then tested to be sure it is compatible with you in  order to minimize the chance of a transfusion reaction. If you or a relative donates blood, this is often done in anticipation of surgery and is not appropriate for emergency situations. It takes many days to process the donated  blood. RISKS AND COMPLICATIONS Although transfusion therapy is very safe and saves many lives, the main dangers of transfusion include:   Getting an infectious disease.  Developing a transfusion reaction. This is an allergic reaction to something in the blood you were given. Every precaution is taken to prevent this. The decision to have a blood transfusion has been considered carefully by your caregiver before blood is given. Blood is not given unless the benefits outweigh the risks. AFTER THE TRANSFUSION  Right after receiving a blood transfusion, you will usually feel much better and more energetic. This is especially true if your red blood cells have gotten low (anemic). The transfusion raises the level of the red blood cells which carry oxygen, and this usually causes an energy increase.  The nurse administering the transfusion will monitor you carefully for complications. HOME CARE INSTRUCTIONS  No special instructions are needed after a transfusion. You may find your energy is better. Speak with your caregiver about any limitations on activity for underlying diseases you may have. SEEK MEDICAL CARE IF:   Your condition is not improving after your transfusion.  You develop redness or irritation at the intravenous (IV) site. SEEK IMMEDIATE MEDICAL CARE IF:  Any of the following symptoms occur over the next 12 hours:  Shaking chills.  You have a temperature by mouth above 102 F (38.9 C), not controlled by medicine.  Chest, back, or muscle pain.  People around you feel you are not acting correctly or are confused.  Shortness of breath or difficulty breathing.  Dizziness and fainting.  You get a rash or develop hives.  You have a decrease in urine  output.  Your urine turns a dark color or changes to pink, red, or brown. Any of the following symptoms occur over the next 10 days:  You have a temperature by mouth above 102 F (38.9 C), not controlled by medicine.  Shortness of breath.  Weakness after normal activity.  The white part of the eye turns yellow (jaundice).  You have a decrease in the amount of urine or are urinating less often.  Your urine turns a dark color or changes to pink, red, or brown. Document Released: 03/07/2000 Document Revised: 06/02/2011 Document Reviewed: 10/25/2007 [facility] Patient Information 2014 [place], [place].  _______________________________________________________________________

## 2015-02-14 ENCOUNTER — Encounter (HOSPITAL_COMMUNITY): Payer: Self-pay

## 2015-02-14 ENCOUNTER — Encounter (HOSPITAL_COMMUNITY)
Admission: RE | Admit: 2015-02-14 | Discharge: 2015-02-14 | Disposition: A | Discharge status: Home / Self Care | Payer: Medicare Other | Source: Ambulatory Visit | Attending: Specialist | Admitting: Specialist

## 2015-02-14 DIAGNOSIS — Z01818 Encounter for other preprocedural examination: Secondary | ICD-10-CM | POA: Insufficient documentation

## 2015-02-14 DIAGNOSIS — M179 Osteoarthritis of knee, unspecified: Secondary | ICD-10-CM | POA: Insufficient documentation

## 2015-02-14 LAB — CBC
HEMATOCRIT: 44.1 % (ref 36.0–46.0)
HEMOGLOBIN: 14 g/dL (ref 12.0–15.0)
MCH: 30.1 pg (ref 26.0–34.0)
MCHC: 31.7 g/dL (ref 30.0–36.0)
MCV: 94.8 fL (ref 78.0–100.0)
Platelets: 244 10*3/uL (ref 150–400)
RBC: 4.65 MIL/uL (ref 3.87–5.11)
RDW: 13.4 % (ref 11.5–15.5)
WBC: 4.2 10*3/uL (ref 4.0–10.5)

## 2015-02-14 LAB — URINALYSIS, ROUTINE W REFLEX MICROSCOPIC
BILIRUBIN URINE: NEGATIVE
GLUCOSE, UA: NEGATIVE mg/dL
Hgb urine dipstick: NEGATIVE
KETONES UR: NEGATIVE mg/dL
Leukocytes, UA: NEGATIVE
NITRITE: NEGATIVE
PH: 5.5 (ref 5.0–8.0)
PROTEIN: NEGATIVE mg/dL
Specific Gravity, Urine: 1.018 (ref 1.005–1.030)

## 2015-02-14 LAB — BASIC METABOLIC PANEL
ANION GAP: 7 (ref 5–15)
BUN: 8 mg/dL (ref 6–20)
CALCIUM: 9.4 mg/dL (ref 8.9–10.3)
CO2: 30 mmol/L (ref 22–32)
Chloride: 104 mmol/L (ref 101–111)
Creatinine, Ser: 0.72 mg/dL (ref 0.44–1.00)
GFR calc non Af Amer: >60 mL/min (ref >60)
GLUCOSE: 103 mg/dL — AB (ref 65–99)
POTASSIUM: 3.9 mmol/L (ref 3.5–5.1)
Sodium: 141 mmol/L (ref 135–145)

## 2015-02-14 LAB — PROTIME-INR
INR: 0.98 (ref 0.00–1.49)
PROTHROMBIN TIME: 13.2 s (ref 11.6–15.2)

## 2015-02-14 LAB — SURGICAL PCR SCREEN
MRSA, PCR: NEGATIVE
Staphylococcus aureus: NEGATIVE

## 2015-02-14 LAB — ABO/RH: ABO/RH(D): A POS

## 2015-02-14 LAB — APTT: aPTT: 29 seconds (ref 24–37)

## 2015-02-23 ENCOUNTER — Encounter (HOSPITAL_COMMUNITY)
Admission: RE | Disposition: A | Discharge status: Home Health | Payer: Self-pay | Source: Ambulatory Visit | Attending: Specialist

## 2015-02-23 ENCOUNTER — Inpatient Hospital Stay (HOSPITAL_COMMUNITY): Payer: Medicare Other | Admitting: Anesthesiology

## 2015-02-23 ENCOUNTER — Inpatient Hospital Stay (HOSPITAL_COMMUNITY)
Admission: RE | Admit: 2015-02-23 | Discharge: 2015-02-25 | DRG: 470 | Disposition: A | Discharge status: Home Health | Payer: Medicare Other | Source: Ambulatory Visit | Attending: Specialist | Admitting: Specialist

## 2015-02-23 ENCOUNTER — Encounter (HOSPITAL_COMMUNITY): Payer: Self-pay | Admitting: *Deleted

## 2015-02-23 DIAGNOSIS — M1711 Unilateral primary osteoarthritis, right knee: Secondary | ICD-10-CM | POA: Diagnosis present

## 2015-02-23 DIAGNOSIS — M179 Osteoarthritis of knee, unspecified: Secondary | ICD-10-CM | POA: Diagnosis not present

## 2015-02-23 DIAGNOSIS — M25561 Pain in right knee: Secondary | ICD-10-CM | POA: Diagnosis not present

## 2015-02-23 DIAGNOSIS — Z9884 Bariatric surgery status: Secondary | ICD-10-CM

## 2015-02-23 DIAGNOSIS — Z6835 Body mass index (BMI) 35.0-35.9, adult: Secondary | ICD-10-CM | POA: Diagnosis not present

## 2015-02-23 DIAGNOSIS — Z01812 Encounter for preprocedural laboratory examination: Secondary | ICD-10-CM | POA: Diagnosis not present

## 2015-02-23 DIAGNOSIS — I10 Essential (primary) hypertension: Secondary | ICD-10-CM | POA: Diagnosis present

## 2015-02-23 DIAGNOSIS — Z96659 Presence of unspecified artificial knee joint: Secondary | ICD-10-CM

## 2015-02-23 HISTORY — PX: TOTAL KNEE ARTHROPLASTY: SHX125

## 2015-02-23 LAB — TYPE AND SCREEN
ABO/RH(D): A POS
ANTIBODY SCREEN: NEGATIVE

## 2015-02-23 SURGERY — ARTHROPLASTY, KNEE, TOTAL
Anesthesia: Spinal | Site: Knee | Laterality: Right

## 2015-02-23 MED ORDER — BUPIVACAINE-EPINEPHRINE 0.25% -1:200000 IJ SOLN
INTRAMUSCULAR | Status: DC | PRN
  Administered 2015-02-23: 30 mL

## 2015-02-23 MED ORDER — DIPHENHYDRAMINE HCL 12.5 MG/5ML PO ELIX: 12.5000 mg | ORAL_SOLUTION | ORAL | Status: DC | PRN

## 2015-02-23 MED ORDER — KETOROLAC TROMETHAMINE 30 MG/ML IJ SOLN
INTRAMUSCULAR | Status: DC | PRN
  Administered 2015-02-23: 30 mg

## 2015-02-23 MED ORDER — BISACODYL 5 MG PO TBEC: 5.0000 mg | DELAYED_RELEASE_TABLET | Freq: Every day | ORAL | Status: DC | PRN

## 2015-02-23 MED ORDER — METHOCARBAMOL 500 MG PO TABS: 500.0000 mg | ORAL_TABLET | Freq: Three times a day (TID) | ORAL | Status: DC | PRN

## 2015-02-23 MED ORDER — HYDROMORPHONE HCL 1 MG/ML IJ SOLN
0.2500 mg | INTRAMUSCULAR | Status: DC | PRN
  Administered 2015-02-23 (×2): 0.25 mg via INTRAVENOUS
  Administered 2015-02-23: 0.5 mg via INTRAVENOUS

## 2015-02-23 MED ORDER — BUPIVACAINE HCL (PF) 0.75 % IJ SOLN
INTRAMUSCULAR | Status: DC | PRN
  Administered 2015-02-23: 1.4 mL via INTRATHECAL

## 2015-02-23 MED ORDER — METHOCARBAMOL 500 MG PO TABS
500.0000 mg | ORAL_TABLET | Freq: Four times a day (QID) | ORAL | Status: DC | PRN
  Administered 2015-02-24 – 2015-02-25 (×3): 500 mg via ORAL
  Filled 2015-02-23 (×3): qty 1

## 2015-02-23 MED ORDER — SODIUM CHLORIDE 0.9 % IJ SOLN
INTRAMUSCULAR | Status: DC | PRN
  Administered 2015-02-23: 30 mL

## 2015-02-23 MED ORDER — FENTANYL CITRATE (PF) 100 MCG/2ML IJ SOLN
INTRAMUSCULAR | Status: AC
  Filled 2015-02-23: qty 2

## 2015-02-23 MED ORDER — KETOROLAC TROMETHAMINE 30 MG/ML IJ SOLN
INTRAMUSCULAR | Status: AC
  Filled 2015-02-23: qty 1

## 2015-02-23 MED ORDER — POLYETHYLENE GLYCOL 3350 17 G PO PACK: 17.0000 g | PACK | Freq: Every day | ORAL | Status: DC | PRN

## 2015-02-23 MED ORDER — HYDROMORPHONE HCL 1 MG/ML IJ SOLN
1.0000 mg | INTRAMUSCULAR | Status: DC | PRN
  Administered 2015-02-23 – 2015-02-24 (×3): 1 mg via INTRAVENOUS
  Filled 2015-02-23 (×3): qty 1

## 2015-02-23 MED ORDER — HYDROMORPHONE HCL 1 MG/ML IJ SOLN
INTRAMUSCULAR | Status: AC
  Filled 2015-02-23: qty 1

## 2015-02-23 MED ORDER — OXYCODONE-ACETAMINOPHEN 5-325 MG PO TABS: 1.0000 | ORAL_TABLET | ORAL | Status: DC | PRN

## 2015-02-23 MED ORDER — SODIUM CHLORIDE 0.9 % IR SOLN
Status: DC | PRN
  Administered 2015-02-23: 3000 mL

## 2015-02-23 MED ORDER — OXYCODONE HCL 5 MG PO TABS
5.0000 mg | ORAL_TABLET | ORAL | Status: DC | PRN
  Administered 2015-02-23 – 2015-02-24 (×3): 10 mg via ORAL
  Administered 2015-02-24: 5 mg via ORAL
  Administered 2015-02-25 (×2): 10 mg via ORAL
  Filled 2015-02-23 (×5): qty 2
  Filled 2015-02-23: qty 1

## 2015-02-23 MED ORDER — MAGNESIUM CITRATE PO SOLN: 1.0000 | Freq: Once | ORAL | Status: DC | PRN

## 2015-02-23 MED ORDER — FENTANYL CITRATE (PF) 100 MCG/2ML IJ SOLN
INTRAMUSCULAR | Status: DC | PRN
  Administered 2015-02-23 (×3): 50 ug via INTRAVENOUS
  Administered 2015-02-23: 100 ug via INTRAVENOUS
  Administered 2015-02-23: 50 ug via INTRAVENOUS

## 2015-02-23 MED ORDER — PROPOFOL 10 MG/ML IV BOLUS
INTRAVENOUS | Status: AC
  Filled 2015-02-23: qty 20

## 2015-02-23 MED ORDER — POTASSIUM CHLORIDE IN NACL 20-0.9 MEQ/L-% IV SOLN
INTRAVENOUS | Status: DC
  Administered 2015-02-23 – 2015-02-24 (×2): via INTRAVENOUS
  Filled 2015-02-23 (×2): qty 1000

## 2015-02-23 MED ORDER — METOCLOPRAMIDE HCL 10 MG PO TABS: 5.0000 mg | ORAL_TABLET | Freq: Three times a day (TID) | ORAL | Status: DC | PRN

## 2015-02-23 MED ORDER — PROPOFOL 500 MG/50ML IV EMUL
INTRAVENOUS | Status: DC | PRN
  Administered 2015-02-23: 75 ug/kg/min via INTRAVENOUS

## 2015-02-23 MED ORDER — PROPOFOL 10 MG/ML IV BOLUS
INTRAVENOUS | Status: AC
  Filled 2015-02-23: qty 40

## 2015-02-23 MED ORDER — CEFAZOLIN SODIUM-DEXTROSE 2-3 GM-% IV SOLR
INTRAVENOUS | Status: AC
  Filled 2015-02-23: qty 50

## 2015-02-23 MED ORDER — DOCUSATE SODIUM 100 MG PO CAPS
100.0000 mg | ORAL_CAPSULE | Freq: Two times a day (BID) | ORAL | Status: DC
  Administered 2015-02-23 – 2015-02-25 (×4): 100 mg via ORAL

## 2015-02-23 MED ORDER — FERROUS SULFATE 325 (65 FE) MG PO TABS
325.0000 mg | ORAL_TABLET | Freq: Three times a day (TID) | ORAL | Status: DC
  Administered 2015-02-24 – 2015-02-25 (×4): 325 mg via ORAL
  Filled 2015-02-23 (×7): qty 1

## 2015-02-23 MED ORDER — ALUM & MAG HYDROXIDE-SIMETH 200-200-20 MG/5ML PO SUSP: 30.0000 mL | ORAL | Status: DC | PRN

## 2015-02-23 MED ORDER — DEXAMETHASONE SODIUM PHOSPHATE 10 MG/ML IJ SOLN
10.0000 mg | Freq: Once | INTRAMUSCULAR | Status: AC
  Administered 2015-02-23: 10 mg via INTRAVENOUS

## 2015-02-23 MED ORDER — ONDANSETRON HCL 4 MG/2ML IJ SOLN
INTRAMUSCULAR | Status: DC | PRN
  Administered 2015-02-23: 4 mg via INTRAVENOUS

## 2015-02-23 MED ORDER — ACETAMINOPHEN 650 MG RE SUPP: 650.0000 mg | Freq: Four times a day (QID) | RECTAL | Status: DC | PRN

## 2015-02-23 MED ORDER — SODIUM CHLORIDE 0.9 % IJ SOLN
INTRAMUSCULAR | Status: AC
  Filled 2015-02-23: qty 50

## 2015-02-23 MED ORDER — CEFAZOLIN SODIUM-DEXTROSE 2-3 GM-% IV SOLR
2.0000 g | INTRAVENOUS | Status: AC
  Administered 2015-02-23: 2 g via INTRAVENOUS

## 2015-02-23 MED ORDER — POVIDONE-IODINE 7.5 % EX SOLN: Freq: Once | CUTANEOUS | Status: DC

## 2015-02-23 MED ORDER — PROPOFOL 500 MG/50ML IV EMUL
INTRAVENOUS | Status: DC | PRN
  Administered 2015-02-23: 40 mg via INTRAVENOUS
  Administered 2015-02-23: 140 mg via INTRAVENOUS

## 2015-02-23 MED ORDER — ACETAMINOPHEN 325 MG PO TABS: 650.0000 mg | ORAL_TABLET | Freq: Four times a day (QID) | ORAL | Status: DC | PRN

## 2015-02-23 MED ORDER — PROMETHAZINE HCL 25 MG/ML IJ SOLN: 6.2500 mg | INTRAMUSCULAR | Status: DC | PRN

## 2015-02-23 MED ORDER — BUPIVACAINE-EPINEPHRINE (PF) 0.25% -1:200000 IJ SOLN
INTRAMUSCULAR | Status: AC
  Filled 2015-02-23: qty 30

## 2015-02-23 MED ORDER — ONDANSETRON HCL 4 MG PO TABS: 4.0000 mg | ORAL_TABLET | Freq: Four times a day (QID) | ORAL | Status: DC | PRN

## 2015-02-23 MED ORDER — LACTATED RINGERS IV SOLN
INTRAVENOUS | Status: DC
  Administered 2015-02-23: 15:00:00 via INTRAVENOUS
  Administered 2015-02-23: 1000 mL via INTRAVENOUS
  Administered 2015-02-23: 14:00:00 via INTRAVENOUS

## 2015-02-23 MED ORDER — MIDAZOLAM HCL 2 MG/2ML IJ SOLN
INTRAMUSCULAR | Status: AC
  Filled 2015-02-23: qty 2

## 2015-02-23 MED ORDER — ADULT MULTIVITAMIN W/MINERALS CH
1.0000 | ORAL_TABLET | Freq: Every day | ORAL | Status: DC
  Administered 2015-02-23 – 2015-02-25 (×3): 1 via ORAL
  Filled 2015-02-23 (×4): qty 1

## 2015-02-23 MED ORDER — MIDAZOLAM HCL 5 MG/5ML IJ SOLN
INTRAMUSCULAR | Status: DC | PRN
  Administered 2015-02-23: 2 mg via INTRAVENOUS

## 2015-02-23 MED ORDER — CEFAZOLIN SODIUM-DEXTROSE 2-3 GM-% IV SOLR
2.0000 g | Freq: Four times a day (QID) | INTRAVENOUS | Status: AC
  Administered 2015-02-23 – 2015-02-24 (×2): 2 g via INTRAVENOUS
  Filled 2015-02-23 (×2): qty 50

## 2015-02-23 MED ORDER — ENOXAPARIN SODIUM 30 MG/0.3ML ~~LOC~~ SOLN
30.0000 mg | Freq: Two times a day (BID) | SUBCUTANEOUS | Status: DC
  Administered 2015-02-24 – 2015-02-25 (×3): 30 mg via SUBCUTANEOUS
  Filled 2015-02-23 (×5): qty 0.3

## 2015-02-23 MED ORDER — ONDANSETRON HCL 4 MG/2ML IJ SOLN
4.0000 mg | Freq: Four times a day (QID) | INTRAMUSCULAR | Status: DC | PRN
  Administered 2015-02-23 – 2015-02-24 (×2): 4 mg via INTRAVENOUS
  Filled 2015-02-23 (×2): qty 2

## 2015-02-23 MED ORDER — SODIUM CHLORIDE 0.9 % IV SOLN
1000.0000 mg | INTRAVENOUS | Status: AC
  Administered 2015-02-23: 1000 mg via INTRAVENOUS
  Filled 2015-02-23: qty 10

## 2015-02-23 MED ORDER — PHENOL 1.4 % MT LIQD: 1.0000 | OROMUCOSAL | Status: DC | PRN

## 2015-02-23 MED ORDER — ASPIRIN EC 325 MG PO TBEC: 325.0000 mg | DELAYED_RELEASE_TABLET | Freq: Two times a day (BID) | ORAL | Status: DC

## 2015-02-23 MED ORDER — IRBESARTAN 150 MG PO TABS
150.0000 mg | ORAL_TABLET | Freq: Every day | ORAL | Status: DC
  Administered 2015-02-23 – 2015-02-25 (×3): 150 mg via ORAL
  Filled 2015-02-23 (×4): qty 1

## 2015-02-23 MED ORDER — DEXAMETHASONE SODIUM PHOSPHATE 10 MG/ML IJ SOLN
10.0000 mg | Freq: Once | INTRAMUSCULAR | Status: AC
  Administered 2015-02-24: 10 mg via INTRAVENOUS
  Filled 2015-02-23 (×2): qty 1

## 2015-02-23 MED ORDER — ONDANSETRON HCL 4 MG/2ML IJ SOLN
INTRAMUSCULAR | Status: AC
  Filled 2015-02-23: qty 2

## 2015-02-23 MED ORDER — MENTHOL 3 MG MT LOZG: 1.0000 | LOZENGE | OROMUCOSAL | Status: DC | PRN

## 2015-02-23 MED ORDER — METOCLOPRAMIDE HCL 5 MG/ML IJ SOLN: 5.0000 mg | Freq: Three times a day (TID) | INTRAMUSCULAR | Status: DC | PRN

## 2015-02-23 MED ORDER — METHOCARBAMOL 1000 MG/10ML IJ SOLN
500.0000 mg | Freq: Four times a day (QID) | INTRAVENOUS | Status: DC | PRN
  Administered 2015-02-23: 500 mg via INTRAVENOUS
  Filled 2015-02-23 (×2): qty 5

## 2015-02-23 MED ORDER — ZOLPIDEM TARTRATE 5 MG PO TABS: 5.0000 mg | ORAL_TABLET | Freq: Every evening | ORAL | Status: DC | PRN

## 2015-02-23 MED ORDER — CALCIUM CARBONATE-VITAMIN D 500-200 MG-UNIT PO TABS
ORAL_TABLET | Freq: Every day | ORAL | Status: DC
  Administered 2015-02-24 – 2015-02-25 (×2): 1 via ORAL
  Filled 2015-02-23 (×3): qty 1

## 2015-02-23 SURGICAL SUPPLY — 56 items
BAG DECANTER FOR FLEXI CONT (MISCELLANEOUS) IMPLANT
BAG ZIPLOCK 12X15 (MISCELLANEOUS) IMPLANT
BANDAGE ELASTIC 4 VELCRO ST LF (GAUZE/BANDAGES/DRESSINGS) ×3 IMPLANT
BANDAGE ELASTIC 6 VELCRO ST LF (GAUZE/BANDAGES/DRESSINGS) ×3 IMPLANT
BLADE SAG 18X100X1.27 (BLADE) ×3 IMPLANT
BLADE SAW SGTL 13.0X1.19X90.0M (BLADE) ×3 IMPLANT
CAPT KNEE TOTAL 3 ×3 IMPLANT
CEMENT HV SMART SET (Cement) ×6 IMPLANT
CLOTH BEACON ORANGE TIMEOUT ST (SAFETY) ×3 IMPLANT
CUFF TOURN SGL QUICK 34 (TOURNIQUET CUFF) ×2
CUFF TRNQT CYL 34X4X40X1 (TOURNIQUET CUFF) ×1 IMPLANT
DECANTER SPIKE VIAL GLASS SM (MISCELLANEOUS) ×3 IMPLANT
DRAPE U-SHAPE 47X51 STRL (DRAPES) ×3 IMPLANT
DRSG AQUACEL AG ADV 3.5X10 (GAUZE/BANDAGES/DRESSINGS) ×3 IMPLANT
DRSG TEGADERM 4X4.75 (GAUZE/BANDAGES/DRESSINGS) ×3 IMPLANT
DURAPREP 26ML APPLICATOR (WOUND CARE) ×9 IMPLANT
ELECT REM PT RETURN 9FT ADLT (ELECTROSURGICAL) ×3
ELECTRODE REM PT RTRN 9FT ADLT (ELECTROSURGICAL) ×1 IMPLANT
EVACUATOR 1/8 PVC DRAIN (DRAIN) ×3 IMPLANT
GAUZE SPONGE 2X2 8PLY STRL LF (GAUZE/BANDAGES/DRESSINGS) ×1 IMPLANT
GLOVE BIOGEL PI IND STRL 8 (GLOVE) ×6 IMPLANT
GLOVE BIOGEL PI INDICATOR 8 (GLOVE) ×12
GLOVE SURG ORTHO 8.0 STRL STRW (GLOVE) ×3 IMPLANT
GLOVE SURG ORTHO 9.0 STRL STRW (GLOVE) ×3 IMPLANT
GLOVE SURG SS PI 7.5 STRL IVOR (GLOVE) ×3 IMPLANT
GOWN STRL REUS W/TWL XL LVL3 (GOWN DISPOSABLE) ×15 IMPLANT
HANDPIECE INTERPULSE COAX TIP (DISPOSABLE) ×2
IMMOBILIZER KNEE 20 (SOFTGOODS) ×3
IMMOBILIZER KNEE 20 THIGH 36 (SOFTGOODS) ×1 IMPLANT
NS IRRIG 1000ML POUR BTL (IV SOLUTION) ×3 IMPLANT
PACK TOTAL KNEE CUSTOM (KITS) ×3 IMPLANT
POSITIONER SURGICAL ARM (MISCELLANEOUS) ×3 IMPLANT
SET HNDPC FAN SPRY TIP SCT (DISPOSABLE) ×1 IMPLANT
SET PAD KNEE POSITIONER (MISCELLANEOUS) ×3 IMPLANT
SPONGE GAUZE 2X2 STER 10/PKG (GAUZE/BANDAGES/DRESSINGS) ×2
SPONGE LAP 18X18 X RAY DECT (DISPOSABLE) IMPLANT
SPONGE SURGIFOAM ABS GEL 100 (HEMOSTASIS) ×3 IMPLANT
STOCKINETTE 6  STRL (DRAPES) ×2
STOCKINETTE 6 STRL (DRAPES) ×1 IMPLANT
SUCTION FRAZIER 12FR DISP (SUCTIONS) IMPLANT
SUT BONE WAX W31G (SUTURE) ×3 IMPLANT
SUT MNCRL AB 3-0 PS2 18 (SUTURE) ×3 IMPLANT
SUT VIC AB 1 CT1 27 (SUTURE) ×8
SUT VIC AB 1 CT1 27XBRD ANTBC (SUTURE) ×4 IMPLANT
SUT VIC AB 2-0 CT1 27 (SUTURE) ×8
SUT VIC AB 2-0 CT1 27XBRD (SUTURE) ×1 IMPLANT
SUT VIC AB 2-0 CT1 TAPERPNT 27 (SUTURE) ×3 IMPLANT
SUT VLOC 180 0 24IN GS25 (SUTURE) ×3 IMPLANT
SYR 50ML LL SCALE MARK (SYRINGE) ×3 IMPLANT
TAPE STRIPS DRAPE STRL (GAUZE/BANDAGES/DRESSINGS) ×3 IMPLANT
TOWER CARTRIDGE SMART MIX (DISPOSABLE) ×3 IMPLANT
TRAY FOLEY W/METER SILVER 14FR (SET/KITS/TRAYS/PACK) IMPLANT
TRAY FOLEY W/METER SILVER 16FR (SET/KITS/TRAYS/PACK) ×3 IMPLANT
WATER STERILE IRR 1500ML POUR (IV SOLUTION) ×3 IMPLANT
WRAP KNEE MAXI GEL POST OP (GAUZE/BANDAGES/DRESSINGS) ×3 IMPLANT
YANKAUER SUCT BULB TIP 10FT TU (MISCELLANEOUS) ×3 IMPLANT

## 2015-02-23 NOTE — Transfer of Care (Signed)
Immediate Anesthesia Transfer of Care Note  Patient: Shawna Ellison  Procedure(s) Performed: Procedure(s): RIGHT TOTAL KNEE ARTHROPLASTY (Right)  Patient Location: PACU  Anesthesia Type:General  Level of Consciousness: sedated, patient cooperative and responds to stimulation  Airway & Oxygen Therapy: Patient Spontanous Breathing and Patient connected to face mask oxygen  Post-op Assessment: Report given to RN and Post -op Vital signs reviewed and stable  Post vital signs: Reviewed and stable  Last Vitals:  Filed Vitals:   02/23/15 1100  BP: 156/87  Pulse: 92  Temp: 36.7 C  Resp: 18    Complications: No apparent anesthesia complications

## 2015-02-23 NOTE — Anesthesia Procedure Notes (Addendum)
Spinal Patient location during procedure: OR Start time: 02/23/2015 1:45 PM End time: 02/23/2015 1:50 PM Staffing Anesthesiologist: MASSAGEE, TERRY Performed by: anesthesiologist  Preanesthetic Checklist Completed: patient identified, site marked, surgical consent, pre-op evaluation, timeout performed, IV checked, risks and benefits discussed and monitors and equipment checked Spinal Block Patient position: sitting Prep: Betadine Patient monitoring: heart rate, continuous pulse ox and blood pressure Approach: midline Location: L3-4 Injection technique: single-shot Needle Needle type: Spinocan  Needle gauge: 22 G Needle length: 9 cm Needle insertion depth: 7 cm  Procedure Name: LMA Insertion Performed by: Gean Maidens Pre-anesthesia Checklist: Emergency Drugs available, Patient identified, Suction available, Patient being monitored and Timeout performed Patient Re-evaluated:Patient Re-evaluated prior to inductionOxygen Delivery Method: Circle system utilized Intubation Type: IV induction Ventilation: Mask ventilation without difficulty LMA: LMA inserted LMA Size: 4.0 Number of attempts: 1 Placement Confirmation: positive ETCO2,  CO2 detector and breath sounds checked- equal and bilateral Tube secured with: Tape Dental Injury: Teeth and Oropharynx as per pre-operative assessment

## 2015-02-23 NOTE — Progress Notes (Signed)
Utilization review completed.  

## 2015-02-23 NOTE — Op Note (Signed)
DATE OF SURGERY:  02/23/2015  TIME: 3:53 PM  PATIENT NAME:  Shawna Ellison    AGE: 69 y.o.   PRE-OPERATIVE DIAGNOSIS:  RIGHT KNEE OA   POST-OPERATIVE DIAGNOSIS:  right knee osteoarthritis  PROCEDURE:  Procedure(s): RIGHT TOTAL KNEE ARTHROPLASTY  SURGEON:  Zaryan Yakubov ANDREW  ASSISTANT:  Bryson Stilwell, PA-C, present and scrubbed throughout the case, critical for assistance with exposure, retraction, instrumentation, and closure.  OPERATIVE IMPLANTS: Depuy PFC Sigma Rotating Platform.  Femur size 3, Tibia size 3, Patella size 35 3-peg oval button, with a 12.5 mm polyethylene insert.   PREOPERATIVE INDICATIONS:   Shawna Ellison is a 69 y.o. year old female with end stage bone on bone arthritis of the knee who failed conservative treatment and elected for Total Knee Arthroplasty.   The risks, benefits, and alternatives were discussed at length including but not limited to the risks of infection, bleeding, nerve injury, stiffness, blood clots, the need for revision surgery, cardiopulmonary complications, among others, and they were willing to proceed.  OPERATIVE DESCRIPTION:  The patient was brought to the operative room and placed in a supine position.  Spinal anesthesia was administered.  IV antibiotics were given.  The lower extremity was prepped and draped in the usual sterile fashion.  Time out was performed.  The leg was elevated and exsanguinated and the tourniquet was inflated.  Anterior quadriceps tendon splitting approach was performed.  The patella was retracted and osteophytes were removed.  The anterior horn of the medial and lateral meniscus was removed and cruciate ligaments resected.   The distal femur was opened with the drill and the intramedullary distal femoral cutting jig was utilized, set at 5 degrees resecting 10 mm off the distal femur.  Care was taken to protect the collateral ligaments.  The distal femoral sizing jig was applied, taking care to avoid  notching.  Then the 4-in-1 cutting jig was applied and the anterior and posterior femur was cut, along with the chamfer cuts.    Then the extramedullary tibial cutting jig was utilized making the appropriate cut using the anterior tibial crest as a reference building in appropriate posterior slope.  Care was taken during the cut to protect the medial and collateral ligaments.  The proximal tibia was removed along with the posterior horns of the menisci.   The posterior medial femoral osteophytes and posterior lateral femoral osteophytes were removed.    The flexion gap was then measured and was symmetric with the extension gap, measured at 12.  I completed the distal femoral preparation using the appropriate jig to prepare the box.  The patella was then measured, and cut with the saw.    The proximal tibia sized and prepared accordingly with the reamer and the punch, and then all components were trialed with the trial insert.  The knee was found to have excellent balance and full motion.    The above named components were then cemented into place and all excess cement was removed.  The trial polyethylene component was in place during cementation, and then was exchanged for the real polyethylene component.    The knee was easily taken through a range of motion and the patella tracked well and the knee irrigated copiously and the parapatellar and subcutaneous tissue closed with vicryl, and monocryl with steri strips for the skin.  The arthrotomy was closed at 90 of flexion. The wounds were dressed with sterile gauze and the tourniquet released and the patient was awakened and returned to the  PACU in stable and satisfactory condition.  There were no complications.  Total tourniquet time was 90 minutes.

## 2015-02-23 NOTE — Anesthesia Preprocedure Evaluation (Addendum)
Anesthesia Evaluation  Patient identified by MRN, date of birth, ID band Patient awake    Reviewed: Allergy & Precautions, NPO status , Patient's Chart, lab work & pertinent test results  Airway Mallampati: II  TM Distance: >3 FB Neck ROM: Full    Dental  (+) Teeth Intact   Pulmonary neg pulmonary ROS,    breath sounds clear to auscultation       Cardiovascular hypertension,  Rhythm:Regular Rate:Normal     Neuro/Psych    GI/Hepatic negative GI ROS, Neg liver ROS,   Endo/Other  negative endocrine ROSMorbid obesity  Renal/GU negative Renal ROS     Musculoskeletal  (+) Arthritis ,   Abdominal (+) + obese,   Peds  Hematology   Anesthesia Other Findings   Reproductive/Obstetrics                            Anesthesia Physical Anesthesia Plan  ASA: II  Anesthesia Plan: Spinal   Post-op Pain Management:    Induction:   Airway Management Planned: Natural Airway and Simple Face Mask  Additional Equipment:   Intra-op Plan:   Post-operative Plan:   Informed Consent: I have reviewed the patients History and Physical, chart, labs and discussed the procedure including the risks, benefits and alternatives for the proposed anesthesia with the patient or authorized representative who has indicated his/her understanding and acceptance.     Plan Discussed with:   Anesthesia Plan Comments:         Anesthesia Quick Evaluation

## 2015-02-23 NOTE — Anesthesia Postprocedure Evaluation (Signed)
Anesthesia Post Note  Patient: Shawna Ellison  Procedure(s) Performed: Procedure(s) (LRB): RIGHT TOTAL KNEE ARTHROPLASTY (Right)  Patient location during evaluation: PACU Anesthesia Type: General and Spinal Level of consciousness: awake and alert Pain management: pain level controlled Vital Signs Assessment: post-procedure vital signs reviewed and stable Respiratory status: spontaneous breathing Cardiovascular status: stable Postop Assessment: patient able to bend at knees Anesthetic complications: no    Last Vitals:  Filed Vitals:   02/23/15 1831 02/23/15 1924  BP: 141/85 135/70  Pulse: 72 94  Temp: 36.4 C 36.5 C  Resp: 18 16    Last Pain:  Filed Vitals:   02/23/15 1924  PainSc: 2                  Elias Bordner,JAMES TERRILL

## 2015-02-23 NOTE — Interval H&P Note (Signed)
History and Physical Interval Note:  02/23/2015 1:27 PM  Shawna Ellison  has presented today for surgery, with the diagnosis of RIGHT KNEE OA   The various methods of treatment have been discussed with the patient and family. After consideration of risks, benefits and other options for treatment, the patient has consented to  Procedure(s): RIGHT TOTAL KNEE ARTHROPLASTY (Right) as a surgical intervention .  The patient's history has been reviewed, patient examined, no change in status, stable for surgery.  I have reviewed the patient's chart and labs.  Questions were answered to the patient's satisfaction.     [name] ANDREW

## 2015-02-24 LAB — CBC
HCT: 37.6 % (ref 36.0–46.0)
HEMOGLOBIN: 12 g/dL (ref 12.0–15.0)
MCH: 30.1 pg (ref 26.0–34.0)
MCHC: 31.9 g/dL (ref 30.0–36.0)
MCV: 94.2 fL (ref 78.0–100.0)
PLATELETS: 236 10*3/uL (ref 150–400)
RBC: 3.99 MIL/uL (ref 3.87–5.11)
RDW: 13.1 % (ref 11.5–15.5)
WBC: 9.4 10*3/uL (ref 4.0–10.5)

## 2015-02-24 LAB — BASIC METABOLIC PANEL
ANION GAP: 5 (ref 5–15)
BUN: 7 mg/dL (ref 6–20)
CHLORIDE: 106 mmol/L (ref 101–111)
CO2: 29 mmol/L (ref 22–32)
Calcium: 8.6 mg/dL — ABNORMAL LOW (ref 8.9–10.3)
Creatinine, Ser: 0.69 mg/dL (ref 0.44–1.00)
GFR calc Af Amer: >60 mL/min (ref >60)
Glucose, Bld: 165 mg/dL — ABNORMAL HIGH (ref 65–99)
POTASSIUM: 4.7 mmol/L (ref 3.5–5.1)
SODIUM: 140 mmol/L (ref 135–145)

## 2015-02-24 NOTE — Progress Notes (Signed)
Patient continues to receive PRN analgesic therapy for post-operative site pain, localizable to the right anterior knee. Patient was encouraged routine use of the Incentive Spirometer device along with deep breathing exercises. Patient was instructed to use the device every  hour while awake at 10 breaths per session and coached to optimal performance. Subsequently, patient demonstrated proficiency in the use of the device.

## 2015-02-24 NOTE — Progress Notes (Signed)
Occupational Therapy Evaluation Patient Details Name: Shawna Ellison MRN: [id number] DOB: 1946-01-11 Today's Date: 02/24/2015    History of Present Illness Pt is a 69 year old female s/p R TKA   Clinical Impression   Limited evaluation due to patient's fatigue/pain from PT session this afternoon. OT will follow up tomorrow to complete education on ADLs/ADL transfers.     Follow Up Recommendations  No OT follow up;Supervision/Assistance - 24 hour    Equipment Recommendations  None recommended by OT    Recommendations for Other Services       Precautions / Restrictions Precautions Precautions: Knee Required Braces or Orthoses: Knee Immobilizer - Right Restrictions RLE Weight Bearing: Weight bearing as tolerated      Mobility Bed Mobility Overal bed mobility: Needs Assistance Bed Mobility: Supine to Sit     Supine to sit: Supervision;HOB elevated        Transfers Overall transfer level: Needs assistance Equipment used: Rolling walker (2 wheeled) Transfers: Sit to/from Stand Sit to Stand: Min guard         General transfer comment: verbal cues for safe technique    Balance                                            ADL Overall ADL's : Needs assistance/impaired                                       General ADL Comments: Patient received in bed, sleepy, reports tired from PT and is trying to get some rest. Husband present at bedside. Patient reports she has been getting up to bathroom with nursing staff using handicapped height toilet and RW, but no 3 in 1. She has a 3 in 1 at home to use if needed. Educated patient on walk-in shower transfer and demonstrated it with RW. Patient would like to practice tomorrow prior to discharge. Educated patient on LB self-care/bathing and dressing techniques. Husband reports he will assist with these tasks at discharge.     Vision     Perception     Praxis      Pertinent  Vitals/Pain Pain Assessment: 0-10 Pain Score: 5  Pain Location: R knee Pain Descriptors / Indicators: Sore Pain Intervention(s): Limited activity within patient's tolerance;Monitored during session     Hand Dominance     Extremity/Trunk Assessment Upper Extremity Assessment Upper Extremity Assessment: Overall WFL for tasks assessed   Lower Extremity Assessment Lower Extremity Assessment: Defer to PT evaluation RLE Deficits / Details: R knee AAROM 45*, able to perform SLR       Communication Communication Communication: No difficulties   Cognition Arousal/Alertness: Awake/alert Behavior During Therapy: WFL for tasks assessed/performed Overall Cognitive Status: Within Functional Limits for tasks assessed                     General Comments       Exercises       Shoulder Instructions      Home Living Family/patient expects to be discharged to:: Private residence Living Arrangements: Spouse/significant other Available Help at Discharge: Family Type of Home: House Home Access: Stairs to enter Technical brewer of Steps: 2 Entrance Stairs-Rails: Right;Left Home Layout: One level     Bathroom Shower/Tub: Occupational psychologist:  Handicapped height Bathroom Accessibility: Yes How Accessible: Accessible via walker Home Equipment: [employer] - 2 wheels;Bedside commode          Prior Functioning/Environment Level of Independence: Independent             OT Diagnosis: Acute pain   OT Problem List: Decreased strength;Decreased range of motion;Decreased activity tolerance;Decreased knowledge of use of DME or AE;Decreased knowledge of precautions;Pain   OT Treatment/Interventions: Self-care/ADL training;DME and/or AE instruction;Therapeutic activities;Patient/family education    OT Goals(Current goals can be found in the care plan section) Acute Rehab OT Goals Patient Stated Goal: to go home tomorrow OT Goal Formulation: With patient Time  For Goal Achievement: 03/10/15 Potential to Achieve Goals: Good  OT Frequency: Min 2X/week   Barriers to D/C:            Co-evaluation              End of Session    Activity Tolerance: Patient limited by fatigue;Patient limited by pain Patient left: in bed;with call bell/phone within reach;with family/visitor present   Time: 1341-1351 OT Time Calculation (min): 10 min Charges:  OT General Charges $OT Visit: 1 Procedure OT Evaluation $Initial OT Evaluation Tier I: 1 Procedure G-Codes:    [name] A 03-04-2015, 1:59 PM

## 2015-02-24 NOTE — Plan of Care (Signed)
Problem: Activity: Goal: Will remain free from falls Outcome: Progressing Falls prevention protocol maintained. Reviewed with patient and reinforced the importance of adherence to safety measures to reduce likelihood for falls or fall-related injuries, such as appropriate use of the call bell,  maintaining the bed in low and locked position, keeping needed items within easy access and consistent wearing of non-skid footwear during ambulation attempts. Patient verbalized understanding and demonstrated compliance.

## 2015-02-24 NOTE — Progress Notes (Signed)
Subjective: 1 Day Post-Op Procedure(s) (LRB): RIGHT TOTAL KNEE ARTHROPLASTY (Right)  Patient reports pain as mild to moderate.  Tolerating POs well.  Denies fever, chills, N/V.  Denies BM, but admits to flatulence.  Patient is in good spirits.  Objective:   VITALS:  Temp:  [97.5 F (36.4 C)-98 F (36.7 C)] 98 F (36.7 C) (12/03 0432) Pulse Rate:  [72-97] 75 (12/03 0432) Resp:  [14-18] 16 (12/03 0432) BP: (104-156)/(40-91) 118/71 mmHg (12/03 0432) SpO2:  [95 %-100 %] 95 % (12/03 0432) Weight:  [82.725 kg (182 lb 6 oz)] 82.725 kg (182 lb 6 oz) (12/02 1100)  General: WDWN patient in NAD. Psych:  Appropriate mood and affect. Neuro:  A&O x 3, Moving all extremities, sensation intact to light touch HEENT:  EOMs intact Chest:  Even non-labored respirations Skin:  Dressing C/D/I, no rashes or lesions.  Drain pulled and sterile guaze reapplied. Extremities: warm/dry, mild edema, no visible erythmea or echymosis.  No lymphadenopathy. Pulses: Popliteus 2+ MSK:  ROM: - 5-10 degrees TKE, full ankle ROM, MMT: patient can perform quad set, (-) Homan's    LABS  Recent Labs  02/24/15 0502  HGB 12.0  WBC 9.4  PLT 236    Recent Labs  02/24/15 0502  NA 140  K 4.7  CL 106  CO2 29  BUN 7  CREATININE 0.69  GLUCOSE 165*   No results for input(s): LABPT, INR in the last 72 hours.   Assessment/Plan: 1 Day Post-Op Procedure(s) (LRB): RIGHT TOTAL KNEE ARTHROPLASTY (Right)  Up with therapy D/C IV fluids Plan for discharge tomorrow  WBAT RLE Drain pulled, sterile guaze applied. Prescriptions are on the chart.  Mechele Claude, PA-C, [employer] Office:  [phone]

## 2015-02-24 NOTE — Progress Notes (Signed)
Physical Therapy Treatment Note    02/24/15 1400  PT Visit Information  Last PT Received On 02/24/15  Assistance Needed +1  History of Present Illness Pt is a 69 year old female s/p R TKA  PT Time Calculation  PT Start Time (ACUTE ONLY) 1304  PT Stop Time (ACUTE ONLY) 1314  PT Time Calculation (min) (ACUTE ONLY) 10 min  Subjective Data  Subjective Pt ambulated in hallway again this afternoon.  Pt mobilizing well and anticipates d/c home tomorrow.  Precautions  Precautions Knee  Required Braces or Orthoses Knee Immobilizer - Right  Restrictions  RLE Weight Bearing WBAT  Pain Assessment  Pain Assessment 0-10  Pain Score 7  Pain Location R knee  Pain Descriptors / Indicators Sore  Pain Intervention(s) Limited activity within patient's tolerance;Monitored during session;Ice applied;Repositioned;Patient requesting pain meds-RN notified  Cognition  Arousal/Alertness Awake/alert  Behavior During Therapy WFL for tasks assessed/performed  Overall Cognitive Status Within Functional Limits for tasks assessed  Bed Mobility  Overal bed mobility Needs Assistance  Bed Mobility Supine to Sit;Sit to Supine  Supine to sit Supervision;HOB elevated  Sit to supine Supervision;HOB elevated  Transfers  Overall transfer level Needs assistance  Equipment used Rolling walker (2 wheeled)  Transfers Sit to/from Stand  Sit to Stand Min guard  General transfer comment verbal cues for safe technique  Ambulation/Gait  Ambulation/Gait assistance Min guard  Ambulation Distance (Feet) 160 Feet  Assistive device Rolling walker (2 wheeled)  Gait Pattern/deviations Step-through pattern;Antalgic;Decreased stance time - right  General Gait Details pt able to perform SLR so ambulated without KI and no buckling observed, verbal cues for RW positioning and step length  PT - End of Session  Activity Tolerance Patient tolerated treatment well  Patient left in bed;with call bell/phone within reach;with bed alarm  set;with family/visitor present  PT - Assessment/Plan  PT Plan Current plan remains appropriate  PT Frequency (ACUTE ONLY) 7X/week  Follow Up Recommendations Home health PT  PT equipment None recommended by PT  PT Goal Progression  Progress towards PT goals Progressing toward goals  PT General Charges  $$ ACUTE PT VISIT 1 Procedure  PT Treatments  $Gait Training 8-22 mins   Carmelia Bake, PT, DPT 02/24/2015 Pager: [phone]

## 2015-02-24 NOTE — Evaluation (Signed)
Physical Therapy Evaluation Patient Details Name: Shawna Ellison MRN: [id number] DOB: 02/07/1946 Today's Date: 02/24/2015   History of Present Illness  Pt is a 69 year old female s/p R TKA  Clinical Impression  Pt is s/p R TKA resulting in the deficits listed below (see PT Problem List).  Pt will benefit from skilled PT to increase their independence and safety with mobility to allow discharge to the venue listed below.   Pt mobilizing well POD #1 and plans to d/c home with spouse.     Follow Up Recommendations Home health PT    Equipment Recommendations  None recommended by PT    Recommendations for Other Services       Precautions / Restrictions Precautions Precautions: Knee Required Braces or Orthoses: Knee Immobilizer - Right Restrictions RLE Weight Bearing: Weight bearing as tolerated      Mobility  Bed Mobility Overal bed mobility: Needs Assistance Bed Mobility: Supine to Sit     Supine to sit: Supervision;HOB elevated        Transfers Overall transfer level: Needs assistance Equipment used: Rolling walker (2 wheeled) Transfers: Sit to/from Stand Sit to Stand: Min guard         General transfer comment: verbal cues for safe technique  Ambulation/Gait Ambulation/Gait assistance: Min guard Ambulation Distance (Feet): 120 Feet Assistive device: Rolling walker (2 wheeled) Gait Pattern/deviations: Step-to pattern;Step-through pattern;Antalgic     General Gait Details: verbal cues for sequence, RW positioning, able to progress to step through pattern  Stairs            Wheelchair Mobility    Modified Rankin (Stroke Patients Only)       Balance                                             Pertinent Vitals/Pain Pain Assessment: 0-10 Pain Score: 3  Pain Location: R knee Pain Descriptors / Indicators: Sore Pain Intervention(s): Limited activity within patient's tolerance;Monitored during session;Repositioned;Premedicated  before session;Ice applied    Home Living Family/patient expects to be discharged to:: Private residence Living Arrangements: Spouse/significant other   Type of Home: House Home Access: Stairs to enter Entrance Stairs-Rails: Psychiatric nurse of Steps: 2 Home Layout: One level Home Equipment: Environmental consultant - 2 wheels      Prior Function Level of Independence: Independent               Hand Dominance        Extremity/Trunk Assessment               Lower Extremity Assessment: RLE deficits/detail RLE Deficits / Details: R knee AAROM 45*, able to perform SLR       Communication   Communication: No difficulties  Cognition Arousal/Alertness: Awake/alert Behavior During Therapy: WFL for tasks assessed/performed Overall Cognitive Status: Within Functional Limits for tasks assessed                      General Comments      Exercises Total Joint Exercises Ankle Circles/Pumps: AROM;Both;20 reps Quad Sets: AROM;Right;10 reps Short Arc Quad: AROM;Right;10 reps Heel Slides: AAROM;Right;10 reps Hip ABduction/ADduction: AROM;Right;10 reps Straight Leg Raises: 10 reps;Right;AROM      Assessment/Plan    PT Assessment Patient needs continued PT services  PT Diagnosis Difficulty walking;Acute pain   PT Problem List Decreased strength;Decreased activity tolerance;Decreased mobility;Decreased range of  motion;Pain;Decreased knowledge of use of DME;Decreased knowledge of precautions  PT Treatment Interventions Functional mobility training;Stair training;Gait training;Therapeutic exercise;Therapeutic activities;DME instruction;Patient/family education   PT Goals (Current goals can be found in the Care Plan section) Acute Rehab PT Goals PT Goal Formulation: With patient Time For Goal Achievement: 02/28/15 Potential to Achieve Goals: Good    Frequency 7X/week   Barriers to discharge        Co-evaluation               End of Session  Equipment Utilized During Treatment: Right knee immobilizer Activity Tolerance: Patient tolerated treatment well Patient left: in chair;with call bell/phone within reach;with family/visitor present Nurse Communication: Mobility status         Time: [id number] PT Time Calculation (min) (ACUTE ONLY): 27 min   Charges:   PT Evaluation $Initial PT Evaluation Tier I: 1 Procedure PT Treatments $Therapeutic Exercise: 8-22 mins   PT G Codes:        Shawna Ellison,Shawna Ellison 02/24/2015, 10:30 AM Carmelia Bake, PT, DPT 02/24/2015 Pager: [phone]

## 2015-02-25 LAB — CBC
HCT: 33.7 % — ABNORMAL LOW (ref 36.0–46.0)
Hemoglobin: 10.6 g/dL — ABNORMAL LOW (ref 12.0–15.0)
MCH: 29.5 pg (ref 26.0–34.0)
MCHC: 31.5 g/dL (ref 30.0–36.0)
MCV: 93.9 fL (ref 78.0–100.0)
PLATELETS: 197 10*3/uL (ref 150–400)
RBC: 3.59 MIL/uL — AB (ref 3.87–5.11)
RDW: 13.4 % (ref 11.5–15.5)
WBC: 7.3 10*3/uL (ref 4.0–10.5)

## 2015-02-25 NOTE — Progress Notes (Signed)
   Subjective:  Patient reports pain as mild to moderate.  Denies N/V/CP/SOB.  Objective:   VITALS:   Filed Vitals:   02/24/15 1035 02/24/15 1445 02/24/15 2209 02/25/15 0541  BP: 107/56 124/73 133/68 108/61  Pulse: 73 83 77 71  Temp: 97.6 F (36.4 C) 97.9 F (36.6 C) 99 F (37.2 C) 98.4 F (36.9 C)  TempSrc: Oral Oral Oral Oral  Resp: 18 18 17 16   Height:      Weight:      SpO2: 98% 92% 93% 99%    ABD soft Sensation intact distally Intact pulses distally Dorsiflexion/Plantar flexion intact Incision: dressing C/D/I Compartment soft   Lab Results  Component Value Date   WBC 7.3 02/25/2015   HGB 10.6* 02/25/2015   HCT 33.7* 02/25/2015   MCV 93.9 02/25/2015   PLT 197 02/25/2015   BMET    Component Value Date/Time   NA 140 02/24/2015 0502   K 4.7 02/24/2015 0502   CL 106 02/24/2015 0502   CO2 29 02/24/2015 0502   GLUCOSE 165* 02/24/2015 0502   BUN 7 02/24/2015 0502   CREATININE 0.69 02/24/2015 0502   CALCIUM 8.6* 02/24/2015 0502   GFRNONAA >60 02/24/2015 0502   GFRAA >60 02/24/2015 0502     Assessment/Plan: 2 Days Post-Op   Active Problems:   Osteoarthritis of right knee   S/P knee replacement   S/P total knee arthroplasty   WBAT with walker DVT ppx PO pain control PT/OT Discharge home. Outpatient PT to start Monday am.   Elie Goody 02/25/2015, 8:08 AM   Rod Can, MD Cell [phone]

## 2015-02-25 NOTE — Discharge Summary (Signed)
Physician Discharge Summary  Patient ID: Shawna Ellison MRN: [id number] DOB/AGE: 67-Nov-1947 67 y.o.  Admit date: 02/23/2015 Discharge date: 02/25/2015  Admission Diagnoses: Right knee OA  Discharge Diagnoses:  Active Problems:   Osteoarthritis of right knee   S/P knee replacement   S/P total knee arthroplasty   Discharged Condition: good  Hospital Course:  AMARACHI MCHATTON is a 69 y.o. who was admitted to [facility]. They were brought to the operating room on 02/23/2015 and underwent Procedure(s): RIGHT TOTAL KNEE ARTHROPLASTY.  Patient tolerated the procedure well and was later transferred to the recovery room and then to the orthopaedic floor for postoperative care.  They were given PO and IV analgesics for pain control following their surgery.  They were given 24 hours of postoperative antibiotics of  Anti-infectives    Start     Dose/Rate Route Frequency Ordered Stop   02/23/15 2000  ceFAZolin (ANCEF) IVPB 2 g/50 mL premix     2 g 100 mL/hr over 30 Minutes Intravenous Every 6 hours 02/23/15 1741 02/24/15 0330   02/23/15 1059  ceFAZolin (ANCEF) IVPB 2 g/50 mL premix     2 g 100 mL/hr over 30 Minutes Intravenous On call to O.R. 02/23/15 1059 02/23/15 1352     and started on DVT prophylaxis in the form of lovenox.   PT and OT were ordered for total joint protocol.  Discharge planning consulted to help with postop disposition and equipment needs.  Patient had a good night on the evening of surgery and started to get up OOB with therapy on day one.  Hemovac drain was pulled without difficulty.  Continued to work with therapy into day two.  Dressing was with normal limits.  The patient had progressed with therapy and meeting their goals. Patient was seen in rounds and was ready to go home.  Consults: n/a  Significant Diagnostic Studies: routine  Treatments: routine  Discharge Exam: Blood pressure 108/61, pulse 71, temperature 98.4 F (36.9 C), temperature source Oral,  resp. rate 16, height 5\' 3"  (1.6 m), weight 82.725 kg (182 lb 6 oz), SpO2 99 %. Well nourished. Alert and oriented x3. RRR, Lungs clear, BS x4. Abdomen soft and non tender. Right Calf soft and non tender. Right knee dressing C/D/I. No DVT signs. Compartment soft. No signs of infection.  Right LE grossly neurovascular intact.  Disposition: 01-Home or Self Care  Discharge Instructions    Call MD / Call 911    Complete by:  As directed   If you experience chest pain or shortness of breath, CALL 911 and be transported to the hospital emergency room.  If you develope a fever above 101 F, pus (white drainage) or increased drainage or redness at the wound, or calf pain, call your surgeon's office.     Call MD / Call 911    Complete by:  As directed   If you experience chest pain or shortness of breath, CALL 911 and be transported to the hospital emergency room.  If you develope a fever above 101 F, pus (white drainage) or increased drainage or redness at the wound, or calf pain, call your surgeon's office.     Constipation Prevention    Complete by:  As directed   Drink plenty of fluids.  Prune juice may be helpful.  You may use a stool softener, such as Colace (over the counter) 100 mg twice a day.  Use MiraLax (over the counter) for constipation as needed.  Constipation Prevention    Complete by:  As directed   Drink plenty of fluids.  Prune juice may be helpful.  You may use a stool softener, such as Colace (over the counter) 100 mg twice a day.  Use MiraLax (over the counter) for constipation as needed.     Diet - low sodium heart healthy    Complete by:  As directed      Diet - low sodium heart healthy    Complete by:  As directed      Discharge instructions    Complete by:  As directed   INSTRUCTIONS AFTER JOINT REPLACEMENT   Remove items at home which could result in a fall. This includes throw rugs or furniture in walking pathways ICE to the affected joint every three hours while awake  for 30 minutes at a time, for at least the first 3-5 days, and then as needed for pain and swelling.  Continue to use ice for pain and swelling. You may notice swelling that will progress down to the foot and ankle.  This is normal after surgery.  Elevate your leg when you are not up walking on it.   Continue to use the breathing machine you got in the hospital (incentive spirometer) which will help keep your temperature down.  It is common for your temperature to cycle up and down following surgery, especially at night when you are not up moving around and exerting yourself.  The breathing machine keeps your lungs expanded and your temperature down.   DIET:  As you were doing prior to hospitalization, we recommend a well-balanced diet.  DRESSING / WOUND CARE / SHOWERING  Keep the surgical dressing until follow up.  The dressing is water proof, so you can shower without any extra covering.  IF THE DRESSING FALLS OFF or the wound gets wet inside, change the dressing with sterile gauze.  Please use good hand washing techniques before changing the dressing.  Do not use any lotions or creams on the incision until instructed by your surgeon.    ACTIVITY  Increase activity slowly as tolerated, but follow the weight bearing instructions below.   No driving for 6 weeks or until further direction given by your physician.  You cannot drive while taking narcotics.  No lifting or carrying greater than 10 lbs. until further directed by your surgeon. Avoid periods of inactivity such as sitting longer than an hour when not asleep. This helps prevent blood clots.  You may return to work once you are authorized by your doctor.     WEIGHT BEARING   Weight bearing as tolerated with assist device (walker, cane, etc) as directed, use it as long as suggested by your surgeon or therapist, typically at least 4-6 weeks.   EXERCISES  Results after joint replacement surgery are often greatly improved when you follow  the exercise, range of motion and muscle strengthening exercises prescribed by your doctor. Safety measures are also important to protect the joint from further injury. Any time any of these exercises cause you to have increased pain or swelling, decrease what you are doing until you are comfortable again and then slowly increase them. If you have problems or questions, call your caregiver or physical therapist for advice.   Rehabilitation is important following a joint replacement. After just a few days of immobilization, the muscles of the leg can become weakened and shrink (atrophy).  These exercises are designed to build up the tone and strength of the  thigh and leg muscles and to improve motion. Often times heat used for twenty to thirty minutes before working out will loosen up your tissues and help with improving the range of motion but do not use heat for the first two weeks following surgery (sometimes heat can increase post-operative swelling).   These exercises can be done on a training (exercise) mat, on the floor, on a table or on a bed. Use whatever works the best and is most comfortable for you.    Use music or television while you are exercising so that the exercises are a pleasant break in your day. This will make your life better with the exercises acting as a break in your routine that you can look forward to.   Perform all exercises about fifteen times, three times per day or as directed.  You should exercise both the operative leg and the other leg as well.   Exercises include:   Quad Sets - Tighten up the muscle on the front of the thigh (Quad) and hold for 5-10 seconds.   Straight Leg Raises - With your knee straight (if you were given a brace, keep it on), lift the leg to 60 degrees, hold for 3 seconds, and slowly lower the leg.  Perform this exercise against resistance later as your leg gets stronger.  Leg Slides: Lying on your back, slowly slide your foot toward your buttocks,  bending your knee up off the floor (only go as far as is comfortable). Then slowly slide your foot back down until your leg is flat on the floor again.  Angel Wings: Lying on your back spread your legs to the side as far apart as you can without causing discomfort.  Hamstring Strength:  Lying on your back, push your heel against the floor with your leg straight by tightening up the muscles of your buttocks.  Repeat, but this time bend your knee to a comfortable angle, and push your heel against the floor.  You may put a pillow under the heel to make it more comfortable if necessary.   A rehabilitation program following joint replacement surgery can speed recovery and prevent re-injury in the future due to weakened muscles. Contact your doctor or a physical therapist for more information on knee rehabilitation.    CONSTIPATION  Constipation is defined medically as fewer than three stools per week and severe constipation as less than one stool per week.  Even if you have a regular bowel pattern at home, your normal regimen is likely to be disrupted due to multiple reasons following surgery.  Combination of anesthesia, postoperative narcotics, change in appetite and fluid intake all can affect your bowels.   YOU MUST use at least one of the following options; they are listed in order of increasing strength to get the job done.  They are all available over the counter, and you may need to use some, POSSIBLY even all of these options:    Drink plenty of fluids (prune juice may be helpful) and high fiber foods Colace 100 mg by mouth twice a day  Senokot for constipation as directed and as needed Dulcolax (bisacodyl), take with full glass of water  Miralax (polyethylene glycol) once or twice a day as needed.  If you have tried all these things and are unable to have a bowel movement in the first 3-4 days after surgery call either your surgeon or your primary doctor.    If you experience loose stools or  diarrhea, hold  the medications until you stool forms back up.  If your symptoms do not get better within 1 week or if they get worse, check with your doctor.  If you experience "the worst abdominal pain ever" or develop nausea or vomiting, please contact the office immediately for further recommendations for treatment.   ITCHING:  If you experience itching with your medications, try taking only a single pain pill, or even half a pain pill at a time.  You can also use Benadryl over the counter for itching or also to help with sleep.   TED HOSE STOCKINGS:  Use stockings on both legs until for at least 2 weeks or as directed by physician office. They may be removed at night for sleeping.  MEDICATIONS:  See your medication summary on the "After Visit Summary" that nursing will review with you.  You may have some home medications which will be placed on hold until you complete the course of blood thinner medication.  It is important for you to complete the blood thinner medication as prescribed.  PRECAUTIONS:  If you experience chest pain or shortness of breath - call 911 immediately for transfer to the hospital emergency department.   If you develop a fever greater that 101 F, purulent drainage from wound, increased redness or drainage from wound, foul odor from the wound/dressing, or calf pain - CONTACT YOUR SURGEON.                                                   FOLLOW-UP APPOINTMENTS:  If you do not already have a post-op appointment, please call the office for an appointment to be seen by your surgeon.  Guidelines for how soon to be seen are listed in your "After Visit Summary", but are typically between 1-4 weeks after surgery.  OTHER INSTRUCTIONS:   Knee Replacement:  Do not place pillow under knee, focus on keeping the knee straight while resting. CPM instructions: 0-90 degrees, 2 hours in the morning, 2 hours in the afternoon, and 2 hours in the evening. Place foam block, curve side up under  heel at all times except when in CPM or when walking.  DO NOT modify, tear, cut, or change the foam block in any way.  MAKE SURE YOU:  Understand these instructions.  Get help right away if you are not doing well or get worse.    Thank you for letting us be a part of your medical care team.  It is a privilege we respect greatly.  We hope these instructions will help you stay on track for a fast and full recovery!     Do not put a pillow under the knee. Place it under the heel.    Complete by:  As directed      Driving restrictions    Complete by:  As directed   No driving for 6 weeks     Increase activity slowly as tolerated    Complete by:  As directed      Increase activity slowly as tolerated    Complete by:  As directed      Lifting restrictions    Complete by:  As directed   No lifting for 6 weeks     TED hose    Complete by:  As directed   Use stockings (TED hose) for 2 weeks  on both leg(s).  You may remove them at night for sleeping.            Medication List    TAKE these medications        aspirin EC 325 MG tablet  Take 1 tablet (325 mg total) by mouth 2 (two) times daily.     CALCIUM CITRATE MALATE-VIT D PO  Take 2 tablets by mouth daily.     HYDROcodone-acetaminophen 5-325 MG tablet  Commonly known as:  NORCO/VICODIN  Take 1 tablet by mouth at bedtime as needed (pain).     methocarbamol 500 MG tablet  Commonly known as:  ROBAXIN  Take 1 tablet (500 mg total) by mouth every 8 (eight) hours as needed for muscle spasms.     multivitamin with minerals Tabs tablet  Take 1 tablet by mouth daily.     oxyCODONE-acetaminophen 5-325 MG tablet  Commonly known as:  ROXICET  Take 1-2 tablets by mouth every 4 (four) hours as needed for severe pain.     valsartan 160 MG tablet  Commonly known as:  DIOVAN  Take 160 mg by mouth daily.     VITAMIN B-12 IJ  Inject 1 Units as directed every 30 (thirty) days.           Follow-up Information    Follow up with  Cynda Familia, MD. Schedule an appointment as soon as possible for a visit in 2 weeks.   Specialty:  Orthopedic Surgery   Contact information:   [address] [place] [address] [phone]       Signed: Lajean Manes 02/25/2015, 3:07 PM

## 2015-02-25 NOTE — Progress Notes (Signed)
Pt discharged; dc instructions and prescriptions given to patient prior to leaving. Pt escorted to lobby in wheel chair via tech, spouse to drive pt home

## 2015-02-25 NOTE — Progress Notes (Signed)
Physical Therapy Treatment Patient Details Name: Shawna Ellison MRN: [id number] DOB: [birth year] Today's Date: 12-Mar-2015    History of Present Illness Pt is a 69 year old female s/p R TKA    PT Comments    Pt mobilizing well and practiced safe stair technique.  Pt eager for d/c so states she will perform exercises once settled at home.  Pt had no further questions/concerns.  Follow Up Recommendations  Outpatient PT     Equipment Recommendations  None recommended by PT    Recommendations for Other Services       Precautions / Restrictions Precautions Precautions: Knee Required Braces or Orthoses: Knee Immobilizer - Right Restrictions RLE Weight Bearing: Weight bearing as tolerated    Mobility  Bed Mobility Overal bed mobility: Modified Independent             General bed mobility comments: NT -- OOB  Transfers Overall transfer level: Needs assistance Equipment used: Rolling walker (2 wheeled) Transfers: Sit to/from Stand Sit to Stand: Supervision         General transfer comment: verbal cues for R LE forward  Ambulation/Gait Ambulation/Gait assistance: Supervision Ambulation Distance (Feet): 160 Feet Assistive device: Rolling walker (2 wheeled) Gait Pattern/deviations: Step-through pattern;Antalgic;Decreased stance time - right     General Gait Details: mobilizing well with RW   Stairs Stairs: Yes Stairs assistance: Min guard Stair Management: Step to pattern;Forwards;One rail Left Number of Stairs: 2 General stair comments: verbal cues for safety and sequence, pt performed once with 2 rails and then again with only one rail  Wheelchair Mobility    Modified Rankin (Stroke Patients Only)       Balance                                    Cognition Arousal/Alertness: Awake/alert Behavior During Therapy: WFL for tasks assessed/performed Overall Cognitive Status: Within Functional Limits for tasks assessed                       Exercises      General Comments        Pertinent Vitals/Pain Pain Assessment: 0-10 Pain Score: 3  Pain Location: R knee Pain Descriptors / Indicators: Aching;Sore Pain Intervention(s): Limited activity within patient's tolerance;Monitored during session;Repositioned;Premedicated before session;Ice applied    Home Living                      Prior Function            PT Goals (current goals can now be found in the care plan section) Acute Rehab PT Goals Patient Stated Goal: to go home today Progress towards PT goals: Progressing toward goals    Frequency  7X/week    PT Plan Current plan remains appropriate    Co-evaluation             End of Session   Activity Tolerance: Patient tolerated treatment well Patient left: with call bell/phone within reach;with family/visitor present;in chair     Time: [id number] PT Time Calculation (min) (ACUTE ONLY): 10 min  Charges:  $Gait Training: 8-22 mins                    G Codes:      Zira Helinski,KATHrine E 03-12-2015, 12:59 PM Carmelia Bake, PT, DPT 12-Mar-2015 Pager: [phone]

## 2015-02-25 NOTE — Progress Notes (Signed)
Occupational Therapy Treatment Patient Details Name: Shawna Ellison MRN: [id number] DOB: 1945-11-19 Today's Date: 02/25/2015    History of present illness Pt is a 69 year old female s/p R TKA   OT comments  Patient able to practice ADL transfers and reviewed bathing/dressing techniques. All education completed and patient plans to discharge home today with husband's assistance as needed. OT will sign off.   Follow Up Recommendations  No OT follow up;Supervision - Intermittent    Equipment Recommendations  None recommended by OT    Recommendations for Other Services      Precautions / Restrictions Precautions Precautions: Knee Required Braces or Orthoses: Knee Immobilizer - Right Restrictions RLE Weight Bearing: Weight bearing as tolerated       Mobility Bed Mobility               General bed mobility comments: NT -- OOB  Transfers Overall transfer level: Needs assistance Equipment used: Rolling walker (2 wheeled) Transfers: Sit to/from Stand Sit to Stand: Supervision              Balance                                   ADL Overall ADL's : Needs assistance/impaired Eating/Feeding: Independent   Grooming: Wash/dry hands;Supervision/safety;Standing   Upper Body Bathing: Set up;Sitting   Lower Body Bathing: Minimal assistance;Sit to/from stand   Upper Body Dressing : Set up;Sitting   Lower Body Dressing: Minimal assistance;Sit to/from stand   Toilet Transfer: Supervision/safety;Comfort height toilet;Ambulation;RW   Toileting- Clothing Manipulation and Hygiene: Supervision/safety;Sit to/from stand   Tub/ Shower Transfer: Walk-in shower;Supervision/safety;Ambulation;Rolling walker   Functional mobility during ADLs: Supervision/safety;Rolling walker General ADL Comments: Patient up in chair. Reviewed bathing/dressing techniques and patient verbalized understanding. Patient ambulated to bathroom to toilet, washed hands at sink, and  practiced shower transfer without difficulty. Patient returned to recliner at end of session.      Vision                     Perception     Praxis      Cognition   Behavior During Therapy: WFL for tasks assessed/performed Overall Cognitive Status: Within Functional Limits for tasks assessed                       Extremity/Trunk Assessment               Exercises     Shoulder Instructions       General Comments      Pertinent Vitals/ Pain       Pain Assessment: 0-10 Pain Score: 3  Pain Location: R knee Pain Descriptors / Indicators: Sore Pain Intervention(s): Limited activity within patient's tolerance;Monitored during session;Ice applied  Home Living                                          Prior Functioning/Environment              Frequency       Progress Toward Goals  OT Goals(current goals can now be found in the care plan section)  Progress towards OT goals: Goals met/education completed, patient discharged from OT  Acute Rehab OT Goals Patient Stated Goal: to go home today  Plan All goals met  and education completed, patient discharged from OT services    Co-evaluation                 End of Session Equipment Utilized During Treatment: Rolling walker;Right knee immobilizer   Activity Tolerance Patient tolerated treatment well   Patient Left in chair;with call bell/phone within reach   Nurse Communication Mobility status        Time: 0981-1914 OT Time Calculation (min): 11 min  Charges: OT General Charges $OT Visit: 1 Procedure OT Treatments $Self Care/Home Management : 8-22 mins  Caysen Whang A 02/25/2015, 10:24 AM

## 2015-02-25 NOTE — Care Management Note (Signed)
Case Management Note  Patient Details  Name: Shawna Ellison MRN: [id number] Date of Birth: 15-Jan-1946  Subjective/Objective:     S/P total knee arthroplasty               Action/Plan: NCM spoke to pt and outpt PT is arranged with [facility]. Has appt with PT at 930 am tomorrow. States she has RW at home. Sister at home to assist with her care.   Expected Discharge Date:  02/25/15               Expected Discharge Plan:  [place]  In-House Referral:     Discharge planning Services  CM Consult   Status of Service:  Completed, signed off  Medicare Important Message Given:    Date Medicare IM Given:    Medicare IM give by:    Date Additional Medicare IM Given:    Additional Medicare Important Message give by:     If discussed at [place] of Stay Meetings, dates discussed:    Additional Comments:  Erenest Rasher, RN 02/25/2015, 11:00 AM

## 2015-02-25 NOTE — Discharge Instructions (Signed)
TOTAL KNEE REPLACEMENT POSTOPERATIVE DIRECTIONS    Knee Rehabilitation, Guidelines Following Surgery  Results after knee surgery are often greatly improved when you follow the exercise, range of motion and muscle strengthening exercises prescribed by your doctor. Safety measures are also important to protect the knee from further injury. Any time any of these exercises cause you to have increased pain or swelling in your knee joint, decrease the amount until you are comfortable again and slowly increase them. If you have problems or questions, call your caregiver or physical therapist for advice.   WEIGHT BEARING Weight bearing as tolerated with assist device (walker, cane, etc) as directed, use it as long as suggested by your surgeon or therapist, typically at least 4-6 weeks.  HOME CARE INSTRUCTIONS  Remove items at home which could result in a fall. This includes throw rugs or furniture in walking pathways.  Continue medications as instructed at time of discharge. You may have some home medications which will be placed on hold until you complete the course of blood thinner medication.  You may start showering once you are discharged home but do not submerge the incision under water. Just pat the incision dry and apply a dry gauze dressing on daily. Walk with walker as instructed.  You may resume a sexual relationship in one month or when given the OK by your doctor.   Use walker as long as suggested by your caregivers.  Avoid periods of inactivity such as sitting longer than an hour when not asleep. This helps prevent blood clots.  You may put full weight on your legs and walk as much as is comfortable.  You may return to work once you are cleared by your doctor.  Do not drive a car for 6 weeks or until released by you surgeon.   Do not drive while taking narcotics.  Wear the elastic stockings for three weeks following surgery during the day but you may remove then at night. Make  sure you keep all of your appointments after your operation with all of your doctors and caregivers. You should call the office at the above phone number and make an appointment for approximately two weeks after the date of your surgery. Do not remove your surgical dressing. The dressing is waterproof; you may take showers in 3 days, but do not take tub baths or submerge the dressing. Please pick up a stool softener and laxative for home use as long as you are requiring pain medications.  ICE to the affected knee every three hours for 30 minutes at a time and then as needed for pain and swelling.  Continue to use ice on the knee for pain and swelling from surgery. You may notice swelling that will progress down to the foot and ankle.  This is normal after surgery.  Elevate the leg when you are not up walking on it.   It is important for you to complete the blood thinner medication as prescribed by your doctor.  Continue to use the breathing machine which will help keep your temperature down.  It is common for your temperature to cycle up and down following surgery, especially at night when you are not up moving around and exerting yourself.  The breathing machine keeps your lungs expanded and your temperature down.  RANGE OF MOTION AND STRENGTHENING EXERCISES  Rehabilitation of the knee is important following a knee injury or an operation. After just a few days of immobilization, the muscles of the thigh  which control the knee become weakened and shrink (atrophy). Knee exercises are designed to build up the tone and strength of the thigh muscles and to improve knee motion. Often times heat used for twenty to thirty minutes before working out will loosen up your tissues and help with improving the range of motion but do not use heat for the first two weeks following surgery. These exercises can be done on a training (exercise) mat, on the floor, on a table or on a bed. Use what ever works the best and is  most comfortable for you Knee exercises include:  Leg Lifts - While your knee is still immobilized in a splint or cast, you can do straight leg raises. Lift the leg to 60 degrees, hold for 3 sec, and slowly lower the leg. Repeat 10-20 times 2-3 times daily. Perform this exercise against resistance later as your knee gets better.  Quad and Hamstring Sets - Tighten up the muscle on the front of the thigh (Quad) and hold for 5-10 sec. Repeat this 10-20 times hourly. Hamstring sets are done by pushing the foot backward against an object and holding for 5-10 sec. Repeat as with quad sets.  A rehabilitation program following serious knee injuries can speed recovery and prevent re-injury in the future due to weakened muscles. Contact your doctor or a physical therapist for more information on knee rehabilitation.   SKILLED REHAB INSTRUCTIONS: If the patient is transferred to a skilled rehab facility following release from the hospital, a list of the current medications will be sent to the facility for the patient to continue.  When discharged from the skilled rehab facility, please have the facility set up the patient's [employer] prior to being released. Also, the skilled facility will be responsible for providing the patient with their medications at time of release from the facility to include their pain medication, the muscle relaxants, and their blood thinner medication. If the patient is still at the rehab facility at time of the two week follow up appointment, the skilled rehab facility will also need to assist the patient in arranging follow up appointment in our office and any transportation needs.  MAKE SURE YOU:  Understand these instructions.  Will watch your condition.  Will get help right away if you are not doing well or get worse.    Pick up stool softner and laxative for home use following surgery while on pain medications. Do NOT remove your dressing. You may shower.  Do  not take tub baths or submerge incision under water. May shower starting three days after surgery. Please use a clean towel to pat the incision dry following showers. Continue to use ice for pain and swelling after surgery. Do not use any lotions or creams on the incision until instructed by your surgeon.

## 2015-02-26 ENCOUNTER — Encounter (HOSPITAL_COMMUNITY): Payer: Self-pay | Admitting: Specialist

## 2015-02-26 ENCOUNTER — Telehealth: Payer: Self-pay | Admitting: *Deleted

## 2015-02-26 DIAGNOSIS — M1711 Unilateral primary osteoarthritis, right knee: Secondary | ICD-10-CM | POA: Diagnosis not present

## 2015-02-26 NOTE — Telephone Encounter (Signed)
Pt was on TCM list was admitted for surgery (R) Total Knee Arthroplasty. Pt will f/u with surgeon Dr. Theda Sers in 2 weeks...Shawna Ellison

## 2015-02-27 ENCOUNTER — Other Ambulatory Visit: Payer: Self-pay | Admitting: Orthopedic Surgery

## 2015-03-01 ENCOUNTER — Ambulatory Visit (INDEPENDENT_AMBULATORY_CARE_PROVIDER_SITE_OTHER): Payer: Medicare Other

## 2015-03-01 DIAGNOSIS — E538 Deficiency of other specified B group vitamins: Secondary | ICD-10-CM | POA: Diagnosis not present

## 2015-03-01 DIAGNOSIS — M1711 Unilateral primary osteoarthritis, right knee: Secondary | ICD-10-CM | POA: Diagnosis not present

## 2015-03-01 MED ORDER — CYANOCOBALAMIN 1000 MCG/ML IJ SOLN
1000.0000 ug | Freq: Once | INTRAMUSCULAR | Status: AC
  Administered 2015-03-01: 1000 ug via INTRAMUSCULAR

## 2015-03-05 DIAGNOSIS — M1711 Unilateral primary osteoarthritis, right knee: Secondary | ICD-10-CM | POA: Diagnosis not present

## 2015-03-08 DIAGNOSIS — M1711 Unilateral primary osteoarthritis, right knee: Secondary | ICD-10-CM | POA: Diagnosis not present

## 2015-03-09 DIAGNOSIS — Z471 Aftercare following joint replacement surgery: Secondary | ICD-10-CM | POA: Diagnosis not present

## 2015-03-09 DIAGNOSIS — Z96651 Presence of right artificial knee joint: Secondary | ICD-10-CM | POA: Diagnosis not present

## 2015-03-12 ENCOUNTER — Encounter: Payer: Medicare Other | Admitting: Internal Medicine

## 2015-03-12 DIAGNOSIS — M1711 Unilateral primary osteoarthritis, right knee: Secondary | ICD-10-CM | POA: Diagnosis not present

## 2015-03-13 ENCOUNTER — Ambulatory Visit (INDEPENDENT_AMBULATORY_CARE_PROVIDER_SITE_OTHER): Payer: Medicare Other | Admitting: Internal Medicine

## 2015-03-13 ENCOUNTER — Other Ambulatory Visit (INDEPENDENT_AMBULATORY_CARE_PROVIDER_SITE_OTHER): Payer: Medicare Other

## 2015-03-13 ENCOUNTER — Encounter: Payer: Self-pay | Admitting: Internal Medicine

## 2015-03-13 VITALS — BP 120/80 | HR 72 | Temp 97.4°F | Resp 16 | Ht 63.0 in | Wt 183.0 lb

## 2015-03-13 DIAGNOSIS — Z0000 Encounter for general adult medical examination without abnormal findings: Secondary | ICD-10-CM | POA: Diagnosis not present

## 2015-03-13 DIAGNOSIS — E538 Deficiency of other specified B group vitamins: Secondary | ICD-10-CM | POA: Diagnosis not present

## 2015-03-13 DIAGNOSIS — Z1159 Encounter for screening for other viral diseases: Secondary | ICD-10-CM | POA: Diagnosis not present

## 2015-03-13 DIAGNOSIS — I10 Essential (primary) hypertension: Secondary | ICD-10-CM

## 2015-03-13 DIAGNOSIS — R739 Hyperglycemia, unspecified: Secondary | ICD-10-CM

## 2015-03-13 DIAGNOSIS — E785 Hyperlipidemia, unspecified: Secondary | ICD-10-CM

## 2015-03-13 LAB — COMPREHENSIVE METABOLIC PANEL
ALK PHOS: 103 U/L (ref 39–117)
ALT: 9 U/L (ref 0–35)
AST: 14 U/L (ref 0–37)
Albumin: 3.4 g/dL — ABNORMAL LOW (ref 3.5–5.2)
BILIRUBIN TOTAL: 0.4 mg/dL (ref 0.2–1.2)
BUN: 13 mg/dL (ref 6–23)
CALCIUM: 9 mg/dL (ref 8.4–10.5)
CO2: 30 meq/L (ref 19–32)
Chloride: 106 mEq/L (ref 96–112)
Creatinine, Ser: 0.64 mg/dL (ref 0.40–1.20)
GFR: 98.2 mL/min (ref >60.00)
GLUCOSE: 90 mg/dL (ref 70–99)
POTASSIUM: 3.8 meq/L (ref 3.5–5.1)
Sodium: 142 mEq/L (ref 135–145)
TOTAL PROTEIN: 6.4 g/dL (ref 6.0–8.3)

## 2015-03-13 LAB — LIPID PANEL
Cholesterol: 129 mg/dL (ref 0–200)
HDL: 47.2 mg/dL (ref >39.00)
LDL CALC: 63 mg/dL (ref 0–99)
NONHDL: 82.02
Total CHOL/HDL Ratio: 3
Triglycerides: 97 mg/dL (ref 0.0–149.0)
VLDL: 19.4 mg/dL (ref 0.0–40.0)

## 2015-03-13 LAB — CBC WITH DIFFERENTIAL/PLATELET
BASOS ABS: 0 10*3/uL (ref 0.0–0.1)
Basophils Relative: 0.9 % (ref 0.0–3.0)
EOS PCT: 3.1 % (ref 0.0–5.0)
Eosinophils Absolute: 0.1 10*3/uL (ref 0.0–0.7)
HEMATOCRIT: 36.7 % (ref 36.0–46.0)
Hemoglobin: 12 g/dL (ref 12.0–15.0)
LYMPHS ABS: 1.9 10*3/uL (ref 0.7–4.0)
LYMPHS PCT: 44.7 % (ref 12.0–46.0)
MCHC: 32.7 g/dL (ref 30.0–36.0)
MCV: 92.3 fl (ref 78.0–100.0)
MONOS PCT: 9.9 % (ref 3.0–12.0)
Monocytes Absolute: 0.4 10*3/uL (ref 0.1–1.0)
NEUTROS PCT: 41.4 % — AB (ref 43.0–77.0)
Neutro Abs: 1.7 10*3/uL (ref 1.4–7.7)
Platelets: 327 10*3/uL (ref 150.0–400.0)
RBC: 3.98 Mil/uL (ref 3.87–5.11)
RDW: 15.5 % (ref 11.5–15.5)
WBC: 4.2 10*3/uL (ref 4.0–10.5)

## 2015-03-13 LAB — HEMOGLOBIN A1C: Hgb A1c MFr Bld: 5.5 % (ref 4.6–6.5)

## 2015-03-13 LAB — TSH: TSH: 1.68 u[IU]/mL (ref 0.35–4.50)

## 2015-03-13 MED ORDER — VALSARTAN 160 MG PO TABS: 160.0000 mg | ORAL_TABLET | Freq: Every day | ORAL | Status: DC

## 2015-03-13 NOTE — Progress Notes (Signed)
Pre visit review using our clinic review tool, if applicable. No additional management support is needed unless otherwise documented below in the visit note. 

## 2015-03-13 NOTE — Patient Instructions (Signed)
Preventive Care for Adults, Female A healthy lifestyle and preventive care can promote health and wellness. Preventive health guidelines for women include the following key practices.  A routine yearly physical is a good way to check with your health care provider about your health and preventive screening. It is a chance to share any concerns and updates on your health and to receive a thorough exam.  Visit your dentist for a routine exam and preventive care every 6 months. Brush your teeth twice a day and floss once a day. Good oral hygiene prevents tooth decay and gum disease.  The frequency of eye exams is based on your age, health, family medical history, use of contact lenses, and other factors. Follow your health care provider's recommendations for frequency of eye exams.  Eat a healthy diet. Foods like vegetables, fruits, whole grains, low-fat dairy products, and lean protein foods contain the nutrients you need without too many calories. Decrease your intake of foods high in solid fats, added sugars, and salt. Eat the right amount of calories for you.Get information about a proper diet from your health care provider, if necessary.  Regular physical exercise is one of the most important things you can do for your health. Most adults should get at least 150 minutes of moderate-intensity exercise (any activity that increases your heart rate and causes you to sweat) each week. In addition, most adults need muscle-strengthening exercises on 2 or more days a week.  Maintain a healthy weight. The body mass index (BMI) is a screening tool to identify possible weight problems. It provides an estimate of body fat based on height and weight. Your health care provider can find your BMI and can help you achieve or maintain a healthy weight.For adults 20 years and older:  A BMI below 18.5 is considered underweight.  A BMI of 18.5 to 24.9 is normal.  A BMI of 25 to 29.9 is considered overweight.  A  BMI of 30 and above is considered obese.  Maintain normal blood lipids and cholesterol levels by exercising and minimizing your intake of saturated fat. Eat a balanced diet with plenty of fruit and vegetables. Blood tests for lipids and cholesterol should begin at age 62 and be repeated every 5 years. If your lipid or cholesterol levels are high, you are over 50, or you are at high risk for heart disease, you may need your cholesterol levels checked more frequently.Ongoing high lipid and cholesterol levels should be treated with medicines if diet and exercise are not working.  If you smoke, find out from your health care provider how to quit. If you do not use tobacco, do not start.  Lung cancer screening is recommended for adults aged 76-80 years who are at high risk for developing lung cancer because of a history of smoking. A yearly low-dose CT scan of the lungs is recommended for people who have at least a 30-pack-year history of smoking and are a current smoker or have quit within the past 15 years. A pack year of smoking is smoking an average of 1 pack of cigarettes a day for 1 year (for example: 1 pack a day for 30 years or 2 packs a day for 15 years). Yearly screening should continue until the smoker has stopped smoking for at least 15 years. Yearly screening should be stopped for people who develop a health problem that would prevent them from having lung cancer treatment.  If you are pregnant, do not drink alcohol. If you are  breastfeeding, be very cautious about drinking alcohol. If you are not pregnant and choose to drink alcohol, do not have more than 1 drink per day. One drink is considered to be 12 ounces (355 mL) of beer, 5 ounces (148 mL) of wine, or 1.5 ounces (44 mL) of liquor.  Avoid use of street drugs. Do not share needles with anyone. Ask for help if you need support or instructions about stopping the use of drugs.  High blood pressure causes heart disease and increases the risk  of stroke. Your blood pressure should be checked at least every 1 to 2 years. Ongoing high blood pressure should be treated with medicines if weight loss and exercise do not work.  If you are 48-6 years old, ask your health care provider if you should take aspirin to prevent strokes.  Diabetes screening is done by taking a blood sample to check your blood glucose level after you have not eaten for a certain period of time (fasting). If you are not overweight and you do not have risk factors for diabetes, you should be screened once every 3 years starting at age 64. If you are overweight or obese and you are 67-31 years of age, you should be screened for diabetes every year as part of your cardiovascular risk assessment.  Breast cancer screening is essential preventive care for women. You should practice "breast self-awareness." This means understanding the normal appearance and feel of your breasts and may include breast self-examination. Any changes detected, no matter how small, should be reported to a health care provider. Women in their 42s and 30s should have a clinical breast exam (CBE) by a health care provider as part of a regular health exam every 1 to 3 years. After age 51, women should have a CBE every year. Starting at age 25, women should consider having a mammogram (breast X-ray test) every year. Women who have a family history of breast cancer should talk to their health care provider about genetic screening. Women at a high risk of breast cancer should talk to their health care providers about having an MRI and a mammogram every year.  Breast cancer gene (BRCA)-related cancer risk assessment is recommended for women who have family members with BRCA-related cancers. BRCA-related cancers include breast, ovarian, tubal, and peritoneal cancers. Having family members with these cancers may be associated with an increased risk for harmful changes (mutations) in the breast cancer genes BRCA1 and  BRCA2. Results of the assessment will determine the need for genetic counseling and BRCA1 and BRCA2 testing.  Your health care provider may recommend that you be screened regularly for cancer of the pelvic organs (ovaries, uterus, and vagina). This screening involves a pelvic examination, including checking for microscopic changes to the surface of your cervix (Pap test). You may be encouraged to have this screening done every 3 years, beginning at age 65.  For women ages 17-65, health care providers may recommend pelvic exams and Pap testing every 3 years, or they may recommend the Pap and pelvic exam, combined with testing for human papilloma virus (HPV), every 5 years. Some types of HPV increase your risk of cervical cancer. Testing for HPV may also be done on women of any age with unclear Pap test results.  Other health care providers may not recommend any screening for nonpregnant women who are considered low risk for pelvic cancer and who do not have symptoms. Ask your health care provider if a screening pelvic exam is right for  you.  If you have had past treatment for cervical cancer or a condition that could lead to cancer, you need Pap tests and screening for cancer for at least 20 years after your treatment. If Pap tests have been discontinued, your risk factors (such as having a new sexual partner) need to be reassessed to determine if screening should resume. Some women have medical problems that increase the chance of getting cervical cancer. In these cases, your health care provider may recommend more frequent screening and Pap tests.  Colorectal cancer can be detected and often prevented. Most routine colorectal cancer screening begins at the age of 50 years and continues through age 75 years. However, your health care provider may recommend screening at an earlier age if you have risk factors for colon cancer. On a yearly basis, your health care provider may provide home test kits to check  for hidden blood in the stool. Use of a small camera at the end of a tube, to directly examine the colon (sigmoidoscopy or colonoscopy), can detect the earliest forms of colorectal cancer. Talk to your health care provider about this at age 50, when routine screening begins. Direct exam of the colon should be repeated every 5-10 years through age 75 years, unless early forms of precancerous polyps or small growths are found.  People who are at an increased risk for hepatitis B should be screened for this virus. You are considered at high risk for hepatitis B if:  You were born in a country where hepatitis B occurs often. Talk with your health care provider about which countries are considered high risk.  Your parents were born in a high-risk country and you have not received a shot to protect against hepatitis B (hepatitis B vaccine).  You have HIV or AIDS.  You use needles to inject street drugs.  You live with, or have sex with, someone who has hepatitis B.  You get hemodialysis treatment.  You take certain medicines for conditions like cancer, organ transplantation, and autoimmune conditions.  Hepatitis C blood testing is recommended for all people born from 1945 through 1965 and any individual with known risks for hepatitis C.  Practice safe sex. Use condoms and avoid high-risk sexual practices to reduce the spread of sexually transmitted infections (STIs). STIs include gonorrhea, chlamydia, syphilis, trichomonas, herpes, HPV, and human immunodeficiency virus (HIV). Herpes, HIV, and HPV are viral illnesses that have no cure. They can result in disability, cancer, and death.  You should be screened for sexually transmitted illnesses (STIs) including gonorrhea and chlamydia if:  You are sexually active and are younger than 24 years.  You are older than 24 years and your health care provider tells you that you are at risk for this type of infection.  Your sexual activity has changed  since you were last screened and you are at an increased risk for chlamydia or gonorrhea. Ask your health care provider if you are at risk.  If you are at risk of being infected with HIV, it is recommended that you take a prescription medicine daily to prevent HIV infection. This is called preexposure prophylaxis (PrEP). You are considered at risk if:  You are sexually active and do not regularly use condoms or know the HIV status of your partner(s).  You take drugs by injection.  You are sexually active with a partner who has HIV.  Talk with your health care provider about whether you are at high risk of being infected with HIV. If   you choose to begin PrEP, you should first be tested for HIV. You should then be tested every 3 months for as long as you are taking PrEP.  Osteoporosis is a disease in which the bones lose minerals and strength with aging. This can result in serious bone fractures or breaks. The risk of osteoporosis can be identified using a bone density scan. Women ages 67 years and over and women at risk for fractures or osteoporosis should discuss screening with their health care providers. Ask your health care provider whether you should take a calcium supplement or vitamin D to reduce the rate of osteoporosis.  Menopause can be associated with physical symptoms and risks. Hormone replacement therapy is available to decrease symptoms and risks. You should talk to your health care provider about whether hormone replacement therapy is right for you.  Use sunscreen. Apply sunscreen liberally and repeatedly throughout the day. You should seek shade when your shadow is shorter than you. Protect yourself by wearing long sleeves, pants, a wide-brimmed hat, and sunglasses year round, whenever you are outdoors.  Once a month, do a whole body skin exam, using a mirror to look at the skin on your back. Tell your health care provider of new moles, moles that have irregular borders, moles that  are larger than a pencil eraser, or moles that have changed in shape or color.  Stay current with required vaccines (immunizations).  Influenza vaccine. All adults should be immunized every year.  Tetanus, diphtheria, and acellular pertussis (Td, Tdap) vaccine. Pregnant women should receive 1 dose of Tdap vaccine during each pregnancy. The dose should be obtained regardless of the length of time since the last dose. Immunization is preferred during the 27th-36th week of gestation. An adult who has not previously received Tdap or who does not know her vaccine status should receive 1 dose of Tdap. This initial dose should be followed by tetanus and diphtheria toxoids (Td) booster doses every 10 years. Adults with an unknown or incomplete history of completing a 3-dose immunization series with Td-containing vaccines should begin or complete a primary immunization series including a Tdap dose. Adults should receive a Td booster every 10 years.  Varicella vaccine. An adult without evidence of immunity to varicella should receive 2 doses or a second dose if she has previously received 1 dose. Pregnant females who do not have evidence of immunity should receive the first dose after pregnancy. This first dose should be obtained before leaving the health care facility. The second dose should be obtained 4-8 weeks after the first dose.  Human papillomavirus (HPV) vaccine. Females aged 13-26 years who have not received the vaccine previously should obtain the 3-dose series. The vaccine is not recommended for use in pregnant females. However, pregnancy testing is not needed before receiving a dose. If a female is found to be pregnant after receiving a dose, no treatment is needed. In that case, the remaining doses should be delayed until after the pregnancy. Immunization is recommended for any person with an immunocompromised condition through the age of 61 years if she did not get any or all doses earlier. During the  3-dose series, the second dose should be obtained 4-8 weeks after the first dose. The third dose should be obtained 24 weeks after the first dose and 16 weeks after the second dose.  Zoster vaccine. One dose is recommended for adults aged 30 years or older unless certain conditions are present.  Measles, mumps, and rubella (MMR) vaccine. Adults born  before 1957 generally are considered immune to measles and mumps. Adults born in 1957 or later should have 1 or more doses of MMR vaccine unless there is a contraindication to the vaccine or there is laboratory evidence of immunity to each of the three diseases. A routine second dose of MMR vaccine should be obtained at least 28 days after the first dose for students attending postsecondary schools, health care workers, or international travelers. People who received inactivated measles vaccine or an unknown type of measles vaccine during 1963-1967 should receive 2 doses of MMR vaccine. People who received inactivated mumps vaccine or an unknown type of mumps vaccine before 1979 and are at high risk for mumps infection should consider immunization with 2 doses of MMR vaccine. For females of childbearing age, rubella immunity should be determined. If there is no evidence of immunity, females who are not pregnant should be vaccinated. If there is no evidence of immunity, females who are pregnant should delay immunization until after pregnancy. Unvaccinated health care workers born before 1957 who lack laboratory evidence of measles, mumps, or rubella immunity or laboratory confirmation of disease should consider measles and mumps immunization with 2 doses of MMR vaccine or rubella immunization with 1 dose of MMR vaccine.  Pneumococcal 13-valent conjugate (PCV13) vaccine. When indicated, a person who is uncertain of his immunization history and has no record of immunization should receive the PCV13 vaccine. All adults 65 years of age and older should receive this  vaccine. An adult aged 19 years or older who has certain medical conditions and has not been previously immunized should receive 1 dose of PCV13 vaccine. This PCV13 should be followed with a dose of pneumococcal polysaccharide (PPSV23) vaccine. Adults who are at high risk for pneumococcal disease should obtain the PPSV23 vaccine at least 8 weeks after the dose of PCV13 vaccine. Adults older than 67 years of age who have normal immune system function should obtain the PPSV23 vaccine dose at least 1 year after the dose of PCV13 vaccine.  Pneumococcal polysaccharide (PPSV23) vaccine. When PCV13 is also indicated, PCV13 should be obtained first. All adults aged 65 years and older should be immunized. An adult younger than age 65 years who has certain medical conditions should be immunized. Any person who resides in a nursing home or long-term care facility should be immunized. An adult smoker should be immunized. People with an immunocompromised condition and certain other conditions should receive both PCV13 and PPSV23 vaccines. People with human immunodeficiency virus (HIV) infection should be immunized as soon as possible after diagnosis. Immunization during chemotherapy or radiation therapy should be avoided. Routine use of PPSV23 vaccine is not recommended for American Indians, [place] Natives, or people younger than 65 years unless there are medical conditions that require PPSV23 vaccine. When indicated, people who have unknown immunization and have no record of immunization should receive PPSV23 vaccine. One-time revaccination 5 years after the first dose of PPSV23 is recommended for people aged 19-64 years who have chronic kidney failure, nephrotic syndrome, asplenia, or immunocompromised conditions. People who received 1-2 doses of PPSV23 before age 65 years should receive another dose of PPSV23 vaccine at age 65 years or later if at least 5 years have passed since the previous dose. Doses of PPSV23 are not  needed for people immunized with PPSV23 at or after age 65 years.  Meningococcal vaccine. Adults with asplenia or persistent complement component deficiencies should receive 2 doses of quadrivalent meningococcal conjugate (MenACWY-D) vaccine. The doses should be obtained   at least 2 months apart. Microbiologists working with certain meningococcal bacteria, [employer] recruits, people at risk during an outbreak, and people who travel to or live in countries with a high rate of meningitis should be immunized. A first-year college student up through age 24 years who is living in a residence hall should receive a dose if she did not receive a dose on or after her 16th birthday. Adults who have certain high-risk conditions should receive one or more doses of vaccine.  Hepatitis A vaccine. Adults who wish to be protected from this disease, have certain high-risk conditions, work with hepatitis A-infected animals, work in hepatitis A research labs, or travel to or work in countries with a high rate of hepatitis A should be immunized. Adults who were previously unvaccinated and who anticipate close contact with an international adoptee during the first 60 days after arrival in the [place] from a country with a high rate of hepatitis A should be immunized.  Hepatitis B vaccine. Adults who wish to be protected from this disease, have certain high-risk conditions, may be exposed to blood or other infectious body fluids, are household contacts or sex partners of hepatitis B positive people, are clients or workers in certain care facilities, or travel to or work in countries with a high rate of hepatitis B should be immunized.  Haemophilus influenzae type b (Hib) vaccine. A previously unvaccinated person with asplenia or sickle cell disease or having a scheduled splenectomy should receive 1 dose of Hib vaccine. Regardless of previous immunization, a recipient of a hematopoietic stem cell transplant should receive a  3-dose series 6-12 months after her successful transplant. Hib vaccine is not recommended for adults with HIV infection. Preventive Services / Frequency Ages 60 to 45 years  Blood pressure check.** / Every 3-5 years.  Lipid and cholesterol check.** / Every 5 years beginning at age 86.  Clinical breast exam.** / Every 3 years for women in their 86s and 72s.  BRCA-related cancer risk assessment.** / For women who have family members with a BRCA-related cancer (breast, ovarian, tubal, or peritoneal cancers).  Pap test.** / Every 2 years from ages 60 through 76. Every 3 years starting at age 31 through age 43 or 54 with a history of 3 consecutive normal Pap tests.  HPV screening.** / Every 3 years from ages 33 through ages 86 to 46 with a history of 3 consecutive normal Pap tests.  Hepatitis C blood test.** / For any individual with known risks for hepatitis C.  Skin self-exam. / Monthly.  Influenza vaccine. / Every year.  Tetanus, diphtheria, and acellular pertussis (Tdap, Td) vaccine.** / Consult your health care provider. Pregnant women should receive 1 dose of Tdap vaccine during each pregnancy. 1 dose of Td every 10 years.  Varicella vaccine.** / Consult your health care provider. Pregnant females who do not have evidence of immunity should receive the first dose after pregnancy.  HPV vaccine. / 3 doses over 6 months, if 72 and younger. The vaccine is not recommended for use in pregnant females. However, pregnancy testing is not needed before receiving a dose.  Measles, mumps, rubella (MMR) vaccine.** / You need at least 1 dose of MMR if you were born in 1957 or later. You may also need a 2nd dose. For females of childbearing age, rubella immunity should be determined. If there is no evidence of immunity, females who are not pregnant should be vaccinated. If there is no evidence of immunity, females who are  pregnant should delay immunization until after pregnancy.  Pneumococcal  13-valent conjugate (PCV13) vaccine.** / Consult your health care provider.  Pneumococcal polysaccharide (PPSV23) vaccine.** / 1 to 2 doses if you smoke cigarettes or if you have certain conditions.  Meningococcal vaccine.** / 1 dose if you are age 68 to 8 years and a Market researcher living in a residence hall, or have one of several medical conditions, you need to get vaccinated against meningococcal disease. You may also need additional booster doses.  Hepatitis A vaccine.** / Consult your health care provider.  Hepatitis B vaccine.** / Consult your health care provider.  Haemophilus influenzae type b (Hib) vaccine.** / Consult your health care provider. Ages 7 to 53 years  Blood pressure check.** / Every year.  Lipid and cholesterol check.** / Every 5 years beginning at age 25 years.  Lung cancer screening. / Every year if you are aged 11-80 years and have a 30-pack-year history of smoking and currently smoke or have quit within the past 15 years. Yearly screening is stopped once you have quit smoking for at least 15 years or develop a health problem that would prevent you from having lung cancer treatment.  Clinical breast exam.** / Every year after age 48 years.  BRCA-related cancer risk assessment.** / For women who have family members with a BRCA-related cancer (breast, ovarian, tubal, or peritoneal cancers).  Mammogram.** / Every year beginning at age 41 years and continuing for as long as you are in good health. Consult with your health care provider.  Pap test.** / Every 3 years starting at age 65 years through age 37 or 70 years with a history of 3 consecutive normal Pap tests.  HPV screening.** / Every 3 years from ages 72 years through ages 60 to 40 years with a history of 3 consecutive normal Pap tests.  Fecal occult blood test (FOBT) of stool. / Every year beginning at age 21 years and continuing until age 5 years. You may not need to do this test if you get  a colonoscopy every 10 years.  Flexible sigmoidoscopy or colonoscopy.** / Every 5 years for a flexible sigmoidoscopy or every 10 years for a colonoscopy beginning at age 35 years and continuing until age 48 years.  Hepatitis C blood test.** / For all people born from [birth year] through 1965 and any individual with known risks for hepatitis C.  Skin self-exam. / Monthly.  Influenza vaccine. / Every year.  Tetanus, diphtheria, and acellular pertussis (Tdap/Td) vaccine.** / Consult your health care provider. Pregnant women should receive 1 dose of Tdap vaccine during each pregnancy. 1 dose of Td every 10 years.  Varicella vaccine.** / Consult your health care provider. Pregnant females who do not have evidence of immunity should receive the first dose after pregnancy.  Zoster vaccine.** / 1 dose for adults aged 30 years or older.  Measles, mumps, rubella (MMR) vaccine.** / You need at least 1 dose of MMR if you were born in 1957 or later. You may also need a second dose. For females of childbearing age, rubella immunity should be determined. If there is no evidence of immunity, females who are not pregnant should be vaccinated. If there is no evidence of immunity, females who are pregnant should delay immunization until after pregnancy.  Pneumococcal 13-valent conjugate (PCV13) vaccine.** / Consult your health care provider.  Pneumococcal polysaccharide (PPSV23) vaccine.** / 1 to 2 doses if you smoke cigarettes or if you have certain conditions.  Meningococcal vaccine.** /  Consult your health care provider.  Hepatitis A vaccine.** / Consult your health care provider.  Hepatitis B vaccine.** / Consult your health care provider.  Haemophilus influenzae type b (Hib) vaccine.** / Consult your health care provider. Ages 64 years and over  Blood pressure check.** / Every year.  Lipid and cholesterol check.** / Every 5 years beginning at age 23 years.  Lung cancer screening. / Every year if you  are aged 16-80 years and have a 30-pack-year history of smoking and currently smoke or have quit within the past 15 years. Yearly screening is stopped once you have quit smoking for at least 15 years or develop a health problem that would prevent you from having lung cancer treatment.  Clinical breast exam.** / Every year after age 74 years.  BRCA-related cancer risk assessment.** / For women who have family members with a BRCA-related cancer (breast, ovarian, tubal, or peritoneal cancers).  Mammogram.** / Every year beginning at age 44 years and continuing for as long as you are in good health. Consult with your health care provider.  Pap test.** / Every 3 years starting at age 58 years through age 22 or 39 years with 3 consecutive normal Pap tests. Testing can be stopped between 65 and 70 years with 3 consecutive normal Pap tests and no abnormal Pap or HPV tests in the past 10 years.  HPV screening.** / Every 3 years from ages 64 years through ages 70 or 61 years with a history of 3 consecutive normal Pap tests. Testing can be stopped between 65 and 70 years with 3 consecutive normal Pap tests and no abnormal Pap or HPV tests in the past 10 years.  Fecal occult blood test (FOBT) of stool. / Every year beginning at age 40 years and continuing until age 27 years. You may not need to do this test if you get a colonoscopy every 10 years.  Flexible sigmoidoscopy or colonoscopy.** / Every 5 years for a flexible sigmoidoscopy or every 10 years for a colonoscopy beginning at age 7 years and continuing until age 32 years.  Hepatitis C blood test.** / For all people born from [birth year] through 1963 and any individual with known risks for hepatitis C.  Osteoporosis screening.** / A one-time screening for women ages 30 years and over and women at risk for fractures or osteoporosis.  Skin self-exam. / Monthly.  Influenza vaccine. / Every year.  Tetanus, diphtheria, and acellular pertussis (Tdap/Td)  vaccine.** / 1 dose of Td every 10 years.  Varicella vaccine.** / Consult your health care provider.  Zoster vaccine.** / 1 dose for adults aged 35 years or older.  Pneumococcal 13-valent conjugate (PCV13) vaccine.** / Consult your health care provider.  Pneumococcal polysaccharide (PPSV23) vaccine.** / 1 dose for all adults aged 46 years and older.  Meningococcal vaccine.** / Consult your health care provider.  Hepatitis A vaccine.** / Consult your health care provider.  Hepatitis B vaccine.** / Consult your health care provider.  Haemophilus influenzae type b (Hib) vaccine.** / Consult your health care provider. ** Family history and personal history of risk and conditions may change your health care provider's recommendations.   This information is not intended to replace advice given to you by your health care provider. Make sure you discuss any questions you have with your health care provider.   Document Released: 05/06/2001 Document Revised: 03/31/2014 Document Reviewed: 08/05/2010 Elsevier Interactive Patient Education [employer].

## 2015-03-14 ENCOUNTER — Encounter: Payer: Self-pay | Admitting: Internal Medicine

## 2015-03-14 LAB — HEPATITIS C ANTIBODY: HCV Ab: NEGATIVE

## 2015-03-14 NOTE — Progress Notes (Signed)
Subjective:  Patient ID: Shawna Ellison, female    DOB: 05-Apr-1945  Age: 69 y.o. MRN: [id number]  CC: Annual Exam   HPI HALA IVORY presents for a physical, she feels well and offers no complaints.  Outpatient Prescriptions Prior to Visit  Medication Sig Dispense Refill  . aspirin EC 325 MG tablet Take 1 tablet (325 mg total) by mouth 2 (two) times daily. 30 tablet 0  . Ca Cit Malate-Cholecalciferol (CALCIUM CITRATE MALATE-VIT D PO) Take 2 tablets by mouth daily.    . Cyanocobalamin (VITAMIN B-12 IJ) Inject 1 Units as directed every 30 (thirty) days.    . methocarbamol (ROBAXIN) 500 MG tablet Take 1 tablet (500 mg total) by mouth every 8 (eight) hours as needed for muscle spasms. 50 tablet 2  . Multiple Vitamin (MULTIVITAMIN WITH MINERALS) TABS tablet Take 1 tablet by mouth daily.    . valsartan (DIOVAN) 160 MG tablet Take 160 mg by mouth daily.    Marland Kitchen HYDROcodone-acetaminophen (NORCO/VICODIN) 5-325 MG tablet Take 1 tablet by mouth at bedtime as needed (pain).    Marland Kitchen oxyCODONE-acetaminophen (ROXICET) 5-325 MG tablet Take 1-2 tablets by mouth every 4 (four) hours as needed for severe pain. 30 tablet 0   No facility-administered medications prior to visit.    ROS Review of Systems  Constitutional: Negative.  Negative for fever, chills, diaphoresis, appetite change and fatigue.  HENT: Negative.   Eyes: Negative.   Respiratory: Negative.  Negative for cough, choking, chest tightness, shortness of breath and stridor.   Cardiovascular: Negative.  Negative for chest pain, palpitations and leg swelling.  Gastrointestinal: Negative.  Negative for nausea, vomiting, abdominal pain, diarrhea, constipation and blood in stool.  Endocrine: Negative.   Genitourinary: Negative.   Musculoskeletal: Negative.  Negative for back pain, gait problem, neck pain and neck stiffness.  Skin: Negative.   Allergic/Immunologic: Negative.   Neurological: Negative.   Hematological: Negative.  Negative for  adenopathy. Does not bruise/bleed easily.  Psychiatric/Behavioral: Negative.     Objective:  BP 120/80 mmHg  Pulse 72  Temp(Src) 97.4 F (36.3 C) (Oral)  Resp 16  Ht 5\' 3"  (1.6 m)  Wt 183 lb (83.008 kg)  BMI 32.43 kg/m2  SpO2 95%  BP Readings from Last 3 Encounters:  03/13/15 120/80  02/25/15 108/61  02/14/15 157/82    Wt Readings from Last 3 Encounters:  03/13/15 183 lb (83.008 kg)  02/23/15 182 lb 6 oz (82.725 kg)  02/14/15 182 lb 6.4 oz (82.736 kg)    Physical Exam  Constitutional: She is oriented to person, place, and time. No distress.  HENT:  Mouth/Throat: Oropharynx is clear and moist. No oropharyngeal exudate.  Eyes: Conjunctivae are normal. Right eye exhibits no discharge. Left eye exhibits no discharge. No scleral icterus.  Neck: Normal range of motion. Neck supple. No JVD present. No tracheal deviation present. No thyromegaly present.  Cardiovascular: Normal rate, regular rhythm, normal heart sounds and intact distal pulses.  Exam reveals no gallop and no friction rub.   No murmur heard. Pulmonary/Chest: Effort normal and breath sounds normal. No stridor. No respiratory distress. She has no wheezes. She has no rales. She exhibits no tenderness.  Abdominal: Soft. Bowel sounds are normal. She exhibits no distension and no mass. There is no tenderness. There is no rebound and no guarding.  Musculoskeletal: Normal range of motion. She exhibits no edema or tenderness.  Lymphadenopathy:    She has no cervical adenopathy.  Neurological: She is oriented to person, place,  and time.  Skin: Skin is warm and dry. No rash noted. She is not diaphoretic. No erythema. No pallor.  Psychiatric: She has a normal mood and affect. Her behavior is normal. Judgment and thought content normal.  Vitals reviewed.   Lab Results  Component Value Date   WBC 4.2 03/13/2015   HGB 12.0 03/13/2015   HCT 36.7 03/13/2015   PLT 327.0 03/13/2015   GLUCOSE 90 03/13/2015   CHOL 129  03/13/2015   TRIG 97.0 03/13/2015   HDL 47.20 03/13/2015   LDLCALC 63 03/13/2015   ALT 9 03/13/2015   AST 14 03/13/2015   NA 142 03/13/2015   K 3.8 03/13/2015   CL 106 03/13/2015   CREATININE 0.64 03/13/2015   BUN 13 03/13/2015   CO2 30 03/13/2015   TSH 1.68 03/13/2015   INR 0.98 02/14/2015   HGBA1C 5.5 03/13/2015    No results found.  Assessment & Plan:   Kamori was seen today for annual exam.  Diagnoses and all orders for this visit:  Essential hypertension, benign- her blood pressure is well-controlled, electrolytes and renal function are stable -     Comprehensive metabolic panel; Future  Hyperglycemia- her blood sugars have improved, no medication is needed for this at this time. -     Comprehensive metabolic panel; Future -     Hemoglobin A1c; Future  Hyperlipidemia with target LDL less than 130- her LDL is in the normal range and I don't think she would benefit from taking a statin. -     Lipid panel; Future -     TSH; Future  Routine general medical examination at a health care facility  Vitamin B12 deficiency (non anemic) -     CBC with Differential/Platelet; Future  Need for hepatitis C screening test -     Hepatitis C Antibody; Future  Other orders -     valsartan (DIOVAN) 160 MG tablet; Take 1 tablet (160 mg total) by mouth daily.   I have discontinued Ms. Borthwick's HYDROcodone-acetaminophen and oxyCODONE-acetaminophen. I have also changed her valsartan. Additionally, I am having her maintain her multivitamin with minerals, Ca Cit Malate-Cholecalciferol (CALCIUM CITRATE MALATE-VIT D PO), Cyanocobalamin (VITAMIN B-12 IJ), methocarbamol, and aspirin EC.  Meds ordered this encounter  Medications  . valsartan (DIOVAN) 160 MG tablet    Sig: Take 1 tablet (160 mg total) by mouth daily.    Dispense:  90 tablet    Refill:  0   See AVS for instructions about healthy living and anticipatory guidance.  Follow-up: Return if symptoms worsen or fail to  improve.  Scarlette Calico, MD

## 2015-03-14 NOTE — Assessment & Plan Note (Signed)

## 2015-03-15 DIAGNOSIS — M1711 Unilateral primary osteoarthritis, right knee: Secondary | ICD-10-CM | POA: Diagnosis not present

## 2015-03-20 DIAGNOSIS — M1711 Unilateral primary osteoarthritis, right knee: Secondary | ICD-10-CM | POA: Diagnosis not present

## 2015-03-20 DIAGNOSIS — Z9884 Bariatric surgery status: Secondary | ICD-10-CM | POA: Diagnosis not present

## 2015-03-22 DIAGNOSIS — M1711 Unilateral primary osteoarthritis, right knee: Secondary | ICD-10-CM | POA: Diagnosis not present

## 2015-03-27 DIAGNOSIS — M1711 Unilateral primary osteoarthritis, right knee: Secondary | ICD-10-CM | POA: Diagnosis not present

## 2015-03-29 DIAGNOSIS — M1711 Unilateral primary osteoarthritis, right knee: Secondary | ICD-10-CM | POA: Diagnosis not present

## 2015-03-30 DIAGNOSIS — Z9884 Bariatric surgery status: Secondary | ICD-10-CM | POA: Diagnosis not present

## 2015-04-02 ENCOUNTER — Ambulatory Visit: Payer: Self-pay

## 2015-04-02 DIAGNOSIS — M1711 Unilateral primary osteoarthritis, right knee: Secondary | ICD-10-CM | POA: Diagnosis not present

## 2015-04-03 ENCOUNTER — Ambulatory Visit (INDEPENDENT_AMBULATORY_CARE_PROVIDER_SITE_OTHER): Payer: Medicare Other

## 2015-04-03 DIAGNOSIS — E538 Deficiency of other specified B group vitamins: Secondary | ICD-10-CM

## 2015-04-03 MED ORDER — CYANOCOBALAMIN 1000 MCG/ML IJ SOLN
1000.0000 ug | Freq: Once | INTRAMUSCULAR | Status: AC
  Administered 2015-04-03: 1000 ug via INTRAMUSCULAR

## 2015-04-06 DIAGNOSIS — Z4789 Encounter for other orthopedic aftercare: Secondary | ICD-10-CM | POA: Diagnosis not present

## 2015-04-10 ENCOUNTER — Ambulatory Visit: Payer: Self-pay | Admitting: Dietician

## 2015-04-10 DIAGNOSIS — G8918 Other acute postprocedural pain: Secondary | ICD-10-CM | POA: Diagnosis not present

## 2015-04-10 DIAGNOSIS — Z96651 Presence of right artificial knee joint: Secondary | ICD-10-CM | POA: Diagnosis not present

## 2015-04-10 DIAGNOSIS — M24661 Ankylosis, right knee: Secondary | ICD-10-CM | POA: Diagnosis not present

## 2015-04-11 DIAGNOSIS — M1711 Unilateral primary osteoarthritis, right knee: Secondary | ICD-10-CM | POA: Diagnosis not present

## 2015-04-12 DIAGNOSIS — M1711 Unilateral primary osteoarthritis, right knee: Secondary | ICD-10-CM | POA: Diagnosis not present

## 2015-04-13 DIAGNOSIS — M1711 Unilateral primary osteoarthritis, right knee: Secondary | ICD-10-CM | POA: Diagnosis not present

## 2015-04-16 DIAGNOSIS — M1711 Unilateral primary osteoarthritis, right knee: Secondary | ICD-10-CM | POA: Diagnosis not present

## 2015-04-17 DIAGNOSIS — M1711 Unilateral primary osteoarthritis, right knee: Secondary | ICD-10-CM | POA: Diagnosis not present

## 2015-04-17 DIAGNOSIS — Z96651 Presence of right artificial knee joint: Secondary | ICD-10-CM | POA: Diagnosis not present

## 2015-04-17 DIAGNOSIS — Z471 Aftercare following joint replacement surgery: Secondary | ICD-10-CM | POA: Diagnosis not present

## 2015-04-18 DIAGNOSIS — M1711 Unilateral primary osteoarthritis, right knee: Secondary | ICD-10-CM | POA: Diagnosis not present

## 2015-04-19 DIAGNOSIS — M1711 Unilateral primary osteoarthritis, right knee: Secondary | ICD-10-CM | POA: Diagnosis not present

## 2015-04-20 DIAGNOSIS — M1711 Unilateral primary osteoarthritis, right knee: Secondary | ICD-10-CM | POA: Diagnosis not present

## 2015-04-23 DIAGNOSIS — M1711 Unilateral primary osteoarthritis, right knee: Secondary | ICD-10-CM | POA: Diagnosis not present

## 2015-04-25 DIAGNOSIS — M1711 Unilateral primary osteoarthritis, right knee: Secondary | ICD-10-CM | POA: Diagnosis not present

## 2015-04-26 DIAGNOSIS — M1711 Unilateral primary osteoarthritis, right knee: Secondary | ICD-10-CM | POA: Diagnosis not present

## 2015-04-27 DIAGNOSIS — Z471 Aftercare following joint replacement surgery: Secondary | ICD-10-CM | POA: Diagnosis not present

## 2015-04-27 DIAGNOSIS — M1711 Unilateral primary osteoarthritis, right knee: Secondary | ICD-10-CM | POA: Diagnosis not present

## 2015-04-27 DIAGNOSIS — Z96651 Presence of right artificial knee joint: Secondary | ICD-10-CM | POA: Diagnosis not present

## 2015-04-30 DIAGNOSIS — M1711 Unilateral primary osteoarthritis, right knee: Secondary | ICD-10-CM | POA: Diagnosis not present

## 2015-05-02 DIAGNOSIS — M1711 Unilateral primary osteoarthritis, right knee: Secondary | ICD-10-CM | POA: Diagnosis not present

## 2015-05-04 ENCOUNTER — Ambulatory Visit (INDEPENDENT_AMBULATORY_CARE_PROVIDER_SITE_OTHER): Payer: Medicare Other

## 2015-05-04 ENCOUNTER — Ambulatory Visit: Payer: Medicare Other

## 2015-05-04 DIAGNOSIS — M1711 Unilateral primary osteoarthritis, right knee: Secondary | ICD-10-CM | POA: Diagnosis not present

## 2015-05-04 DIAGNOSIS — E538 Deficiency of other specified B group vitamins: Secondary | ICD-10-CM | POA: Diagnosis not present

## 2015-05-04 MED ORDER — CYANOCOBALAMIN 1000 MCG/ML IJ SOLN
1000.0000 ug | Freq: Once | INTRAMUSCULAR | Status: AC
  Administered 2015-05-04: 1000 ug via INTRAMUSCULAR

## 2015-05-08 DIAGNOSIS — M1711 Unilateral primary osteoarthritis, right knee: Secondary | ICD-10-CM | POA: Diagnosis not present

## 2015-05-11 DIAGNOSIS — M1711 Unilateral primary osteoarthritis, right knee: Secondary | ICD-10-CM | POA: Diagnosis not present

## 2015-05-15 DIAGNOSIS — M1711 Unilateral primary osteoarthritis, right knee: Secondary | ICD-10-CM | POA: Diagnosis not present

## 2015-05-18 DIAGNOSIS — M1711 Unilateral primary osteoarthritis, right knee: Secondary | ICD-10-CM | POA: Diagnosis not present

## 2015-05-22 DIAGNOSIS — M1711 Unilateral primary osteoarthritis, right knee: Secondary | ICD-10-CM | POA: Diagnosis not present

## 2015-05-25 DIAGNOSIS — M1711 Unilateral primary osteoarthritis, right knee: Secondary | ICD-10-CM | POA: Diagnosis not present

## 2015-05-29 DIAGNOSIS — M1711 Unilateral primary osteoarthritis, right knee: Secondary | ICD-10-CM | POA: Diagnosis not present

## 2015-06-01 DIAGNOSIS — Z96651 Presence of right artificial knee joint: Secondary | ICD-10-CM | POA: Diagnosis not present

## 2015-06-01 DIAGNOSIS — Z471 Aftercare following joint replacement surgery: Secondary | ICD-10-CM | POA: Diagnosis not present

## 2015-06-04 ENCOUNTER — Ambulatory Visit (INDEPENDENT_AMBULATORY_CARE_PROVIDER_SITE_OTHER): Payer: Medicare Other

## 2015-06-04 DIAGNOSIS — E538 Deficiency of other specified B group vitamins: Secondary | ICD-10-CM | POA: Diagnosis not present

## 2015-06-04 MED ORDER — CYANOCOBALAMIN 1000 MCG/ML IJ SOLN
1000.0000 ug | Freq: Once | INTRAMUSCULAR | Status: AC
  Administered 2015-06-04: 1000 ug via INTRAMUSCULAR

## 2015-07-02 DIAGNOSIS — M1712 Unilateral primary osteoarthritis, left knee: Secondary | ICD-10-CM | POA: Diagnosis not present

## 2015-07-02 DIAGNOSIS — Z471 Aftercare following joint replacement surgery: Secondary | ICD-10-CM | POA: Diagnosis not present

## 2015-07-02 DIAGNOSIS — Z96651 Presence of right artificial knee joint: Secondary | ICD-10-CM | POA: Diagnosis not present

## 2015-07-09 ENCOUNTER — Ambulatory Visit (INDEPENDENT_AMBULATORY_CARE_PROVIDER_SITE_OTHER): Payer: Medicare Other

## 2015-07-09 DIAGNOSIS — E538 Deficiency of other specified B group vitamins: Secondary | ICD-10-CM | POA: Diagnosis not present

## 2015-07-09 MED ORDER — CYANOCOBALAMIN 1000 MCG/ML IJ SOLN
1000.0000 ug | Freq: Once | INTRAMUSCULAR | Status: AC
  Administered 2015-07-09: 1000 ug via INTRAMUSCULAR

## 2015-08-09 ENCOUNTER — Ambulatory Visit (INDEPENDENT_AMBULATORY_CARE_PROVIDER_SITE_OTHER): Payer: Medicare Other

## 2015-08-09 DIAGNOSIS — E538 Deficiency of other specified B group vitamins: Secondary | ICD-10-CM | POA: Diagnosis not present

## 2015-08-09 MED ORDER — CYANOCOBALAMIN 1000 MCG/ML IJ SOLN
1000.0000 ug | Freq: Once | INTRAMUSCULAR | Status: AC
  Administered 2015-08-09: 1000 ug via INTRAMUSCULAR

## 2015-08-21 ENCOUNTER — Other Ambulatory Visit: Payer: Self-pay | Admitting: Internal Medicine

## 2015-09-10 ENCOUNTER — Ambulatory Visit (INDEPENDENT_AMBULATORY_CARE_PROVIDER_SITE_OTHER): Payer: Medicare Other

## 2015-09-10 DIAGNOSIS — E538 Deficiency of other specified B group vitamins: Secondary | ICD-10-CM | POA: Diagnosis not present

## 2015-09-10 MED ORDER — CYANOCOBALAMIN 1000 MCG/ML IJ SOLN
1000.0000 ug | Freq: Once | INTRAMUSCULAR | Status: AC
  Administered 2015-09-10: 1000 ug via INTRAMUSCULAR

## 2015-10-01 DIAGNOSIS — Z96651 Presence of right artificial knee joint: Secondary | ICD-10-CM | POA: Diagnosis not present

## 2015-10-01 DIAGNOSIS — Z471 Aftercare following joint replacement surgery: Secondary | ICD-10-CM | POA: Diagnosis not present

## 2015-10-11 ENCOUNTER — Ambulatory Visit (INDEPENDENT_AMBULATORY_CARE_PROVIDER_SITE_OTHER): Payer: Medicare Other

## 2015-10-11 DIAGNOSIS — E538 Deficiency of other specified B group vitamins: Secondary | ICD-10-CM

## 2015-10-11 MED ORDER — CYANOCOBALAMIN 1000 MCG/ML IJ SOLN
1000.0000 ug | Freq: Once | INTRAMUSCULAR | Status: AC
  Administered 2015-10-11: 1000 ug via INTRAMUSCULAR

## 2015-10-25 DIAGNOSIS — M1712 Unilateral primary osteoarthritis, left knee: Secondary | ICD-10-CM | POA: Diagnosis not present

## 2015-11-19 ENCOUNTER — Ambulatory Visit (INDEPENDENT_AMBULATORY_CARE_PROVIDER_SITE_OTHER): Payer: Medicare Other

## 2015-11-19 DIAGNOSIS — Z23 Encounter for immunization: Secondary | ICD-10-CM | POA: Diagnosis not present

## 2015-11-19 DIAGNOSIS — E538 Deficiency of other specified B group vitamins: Secondary | ICD-10-CM | POA: Diagnosis not present

## 2015-11-19 MED ORDER — CYANOCOBALAMIN 1000 MCG/ML IJ SOLN
1000.0000 ug | Freq: Once | INTRAMUSCULAR | Status: AC
  Administered 2015-11-19: 1000 ug via INTRAMUSCULAR

## 2015-12-24 ENCOUNTER — Ambulatory Visit (INDEPENDENT_AMBULATORY_CARE_PROVIDER_SITE_OTHER): Payer: Medicare Other

## 2015-12-24 DIAGNOSIS — E538 Deficiency of other specified B group vitamins: Secondary | ICD-10-CM | POA: Diagnosis not present

## 2015-12-24 MED ORDER — CYANOCOBALAMIN 1000 MCG/ML IJ SOLN
1000.0000 ug | Freq: Once | INTRAMUSCULAR | Status: AC
  Administered 2015-12-24: 1000 ug via INTRAMUSCULAR

## 2015-12-26 DIAGNOSIS — Z1389 Encounter for screening for other disorder: Secondary | ICD-10-CM | POA: Diagnosis not present

## 2015-12-26 DIAGNOSIS — Z6826 Body mass index (BMI) 26.0-26.9, adult: Secondary | ICD-10-CM | POA: Diagnosis not present

## 2015-12-26 DIAGNOSIS — I10 Essential (primary) hypertension: Secondary | ICD-10-CM | POA: Diagnosis not present

## 2015-12-26 DIAGNOSIS — M1990 Unspecified osteoarthritis, unspecified site: Secondary | ICD-10-CM | POA: Diagnosis not present

## 2015-12-26 DIAGNOSIS — R42 Dizziness and giddiness: Secondary | ICD-10-CM | POA: Diagnosis not present

## 2015-12-26 DIAGNOSIS — E538 Deficiency of other specified B group vitamins: Secondary | ICD-10-CM | POA: Diagnosis not present

## 2015-12-26 DIAGNOSIS — Z9884 Bariatric surgery status: Secondary | ICD-10-CM | POA: Diagnosis not present

## 2016-01-10 DIAGNOSIS — D2261 Melanocytic nevi of right upper limb, including shoulder: Secondary | ICD-10-CM | POA: Diagnosis not present

## 2016-01-10 DIAGNOSIS — D485 Neoplasm of uncertain behavior of skin: Secondary | ICD-10-CM | POA: Diagnosis not present

## 2016-01-10 DIAGNOSIS — L821 Other seborrheic keratosis: Secondary | ICD-10-CM | POA: Diagnosis not present

## 2016-01-10 DIAGNOSIS — L819 Disorder of pigmentation, unspecified: Secondary | ICD-10-CM | POA: Diagnosis not present

## 2016-01-10 DIAGNOSIS — D2239 Melanocytic nevi of other parts of face: Secondary | ICD-10-CM | POA: Diagnosis not present

## 2016-01-10 DIAGNOSIS — L814 Other melanin hyperpigmentation: Secondary | ICD-10-CM | POA: Diagnosis not present

## 2016-01-10 DIAGNOSIS — D225 Melanocytic nevi of trunk: Secondary | ICD-10-CM | POA: Diagnosis not present

## 2016-01-10 DIAGNOSIS — L570 Actinic keratosis: Secondary | ICD-10-CM | POA: Diagnosis not present

## 2016-01-10 DIAGNOSIS — D1801 Hemangioma of skin and subcutaneous tissue: Secondary | ICD-10-CM | POA: Diagnosis not present

## 2016-01-25 ENCOUNTER — Telehealth: Payer: Self-pay | Admitting: Internal Medicine

## 2016-01-25 NOTE — Telephone Encounter (Signed)
Called Shawna Ellison to schedule awv. Left message for pt to cal office to schedule appt.

## 2016-02-06 DIAGNOSIS — E538 Deficiency of other specified B group vitamins: Secondary | ICD-10-CM | POA: Diagnosis not present

## 2016-02-12 NOTE — Telephone Encounter (Signed)
2nd call to Mrs. Shawna Ellison. Left msg with husband for pt to call the office and schedule awv appt.

## 2016-02-25 DIAGNOSIS — M1712 Unilateral primary osteoarthritis, left knee: Secondary | ICD-10-CM | POA: Diagnosis not present

## 2016-02-25 DIAGNOSIS — Z96651 Presence of right artificial knee joint: Secondary | ICD-10-CM | POA: Diagnosis not present

## 2016-02-25 DIAGNOSIS — Z471 Aftercare following joint replacement surgery: Secondary | ICD-10-CM | POA: Diagnosis not present

## 2016-02-26 DIAGNOSIS — Z803 Family history of malignant neoplasm of breast: Secondary | ICD-10-CM | POA: Diagnosis not present

## 2016-02-26 DIAGNOSIS — Z1231 Encounter for screening mammogram for malignant neoplasm of breast: Secondary | ICD-10-CM | POA: Diagnosis not present

## 2016-02-28 DIAGNOSIS — N390 Urinary tract infection, site not specified: Secondary | ICD-10-CM | POA: Diagnosis not present

## 2016-02-28 DIAGNOSIS — R300 Dysuria: Secondary | ICD-10-CM | POA: Diagnosis not present

## 2016-03-06 DIAGNOSIS — E538 Deficiency of other specified B group vitamins: Secondary | ICD-10-CM | POA: Diagnosis not present

## 2016-04-15 DIAGNOSIS — E538 Deficiency of other specified B group vitamins: Secondary | ICD-10-CM | POA: Diagnosis not present

## 2016-04-21 DIAGNOSIS — Z0000 Encounter for general adult medical examination without abnormal findings: Secondary | ICD-10-CM | POA: Diagnosis not present

## 2016-04-21 DIAGNOSIS — E538 Deficiency of other specified B group vitamins: Secondary | ICD-10-CM | POA: Diagnosis not present

## 2016-04-21 DIAGNOSIS — R8299 Other abnormal findings in urine: Secondary | ICD-10-CM | POA: Diagnosis not present

## 2016-04-21 DIAGNOSIS — I10 Essential (primary) hypertension: Secondary | ICD-10-CM | POA: Diagnosis not present

## 2016-04-25 DIAGNOSIS — Z0000 Encounter for general adult medical examination without abnormal findings: Secondary | ICD-10-CM | POA: Diagnosis not present

## 2016-04-25 DIAGNOSIS — M859 Disorder of bone density and structure, unspecified: Secondary | ICD-10-CM | POA: Diagnosis not present

## 2016-04-25 DIAGNOSIS — M1990 Unspecified osteoarthritis, unspecified site: Secondary | ICD-10-CM | POA: Diagnosis not present

## 2016-04-25 DIAGNOSIS — E538 Deficiency of other specified B group vitamins: Secondary | ICD-10-CM | POA: Diagnosis not present

## 2016-04-25 DIAGNOSIS — Z6826 Body mass index (BMI) 26.0-26.9, adult: Secondary | ICD-10-CM | POA: Diagnosis not present

## 2016-04-25 DIAGNOSIS — I10 Essential (primary) hypertension: Secondary | ICD-10-CM | POA: Diagnosis not present

## 2016-04-25 DIAGNOSIS — Z1389 Encounter for screening for other disorder: Secondary | ICD-10-CM | POA: Diagnosis not present

## 2016-04-25 DIAGNOSIS — Z9884 Bariatric surgery status: Secondary | ICD-10-CM | POA: Diagnosis not present

## 2016-05-02 DIAGNOSIS — Z1212 Encounter for screening for malignant neoplasm of rectum: Secondary | ICD-10-CM | POA: Diagnosis not present

## 2016-05-20 DIAGNOSIS — E538 Deficiency of other specified B group vitamins: Secondary | ICD-10-CM | POA: Diagnosis not present

## 2016-05-26 DIAGNOSIS — Z124 Encounter for screening for malignant neoplasm of cervix: Secondary | ICD-10-CM | POA: Diagnosis not present

## 2016-06-18 DIAGNOSIS — E538 Deficiency of other specified B group vitamins: Secondary | ICD-10-CM | POA: Diagnosis not present

## 2016-06-26 DIAGNOSIS — M1712 Unilateral primary osteoarthritis, left knee: Secondary | ICD-10-CM | POA: Diagnosis not present

## 2016-07-03 DIAGNOSIS — Z9884 Bariatric surgery status: Secondary | ICD-10-CM | POA: Diagnosis not present

## 2016-07-22 DIAGNOSIS — E538 Deficiency of other specified B group vitamins: Secondary | ICD-10-CM | POA: Diagnosis not present

## 2016-09-02 DIAGNOSIS — E538 Deficiency of other specified B group vitamins: Secondary | ICD-10-CM | POA: Diagnosis not present

## 2016-10-03 DIAGNOSIS — M1712 Unilateral primary osteoarthritis, left knee: Secondary | ICD-10-CM | POA: Diagnosis not present

## 2016-10-07 DIAGNOSIS — E538 Deficiency of other specified B group vitamins: Secondary | ICD-10-CM | POA: Diagnosis not present

## 2016-10-22 DIAGNOSIS — I10 Essential (primary) hypertension: Secondary | ICD-10-CM | POA: Diagnosis not present

## 2016-10-22 DIAGNOSIS — F438 Other reactions to severe stress: Secondary | ICD-10-CM | POA: Diagnosis not present

## 2016-10-22 DIAGNOSIS — E538 Deficiency of other specified B group vitamins: Secondary | ICD-10-CM | POA: Diagnosis not present

## 2016-10-22 DIAGNOSIS — Z9884 Bariatric surgery status: Secondary | ICD-10-CM | POA: Diagnosis not present

## 2016-10-22 DIAGNOSIS — M859 Disorder of bone density and structure, unspecified: Secondary | ICD-10-CM | POA: Diagnosis not present

## 2016-10-22 DIAGNOSIS — Z6826 Body mass index (BMI) 26.0-26.9, adult: Secondary | ICD-10-CM | POA: Diagnosis not present

## 2016-10-27 DIAGNOSIS — M85851 Other specified disorders of bone density and structure, right thigh: Secondary | ICD-10-CM | POA: Diagnosis not present

## 2016-11-05 DIAGNOSIS — E538 Deficiency of other specified B group vitamins: Secondary | ICD-10-CM | POA: Diagnosis not present

## 2016-12-11 DIAGNOSIS — Z23 Encounter for immunization: Secondary | ICD-10-CM | POA: Diagnosis not present

## 2016-12-11 DIAGNOSIS — E538 Deficiency of other specified B group vitamins: Secondary | ICD-10-CM | POA: Diagnosis not present

## 2016-12-29 DIAGNOSIS — M1712 Unilateral primary osteoarthritis, left knee: Secondary | ICD-10-CM | POA: Diagnosis not present

## 2017-01-05 DIAGNOSIS — Z6827 Body mass index (BMI) 27.0-27.9, adult: Secondary | ICD-10-CM | POA: Diagnosis not present

## 2017-01-05 DIAGNOSIS — Z01818 Encounter for other preprocedural examination: Secondary | ICD-10-CM | POA: Diagnosis not present

## 2017-01-05 DIAGNOSIS — I10 Essential (primary) hypertension: Secondary | ICD-10-CM | POA: Diagnosis not present

## 2017-01-21 DIAGNOSIS — E538 Deficiency of other specified B group vitamins: Secondary | ICD-10-CM | POA: Diagnosis not present

## 2017-02-17 DIAGNOSIS — E538 Deficiency of other specified B group vitamins: Secondary | ICD-10-CM | POA: Diagnosis not present

## 2017-03-04 ENCOUNTER — Ambulatory Visit: Payer: Self-pay | Admitting: Orthopedic Surgery

## 2017-03-06 DIAGNOSIS — Z1231 Encounter for screening mammogram for malignant neoplasm of breast: Secondary | ICD-10-CM | POA: Diagnosis not present

## 2017-03-12 DIAGNOSIS — D692 Other nonthrombocytopenic purpura: Secondary | ICD-10-CM | POA: Diagnosis not present

## 2017-03-12 DIAGNOSIS — L814 Other melanin hyperpigmentation: Secondary | ICD-10-CM | POA: Diagnosis not present

## 2017-03-12 DIAGNOSIS — L819 Disorder of pigmentation, unspecified: Secondary | ICD-10-CM | POA: Diagnosis not present

## 2017-03-12 DIAGNOSIS — D1801 Hemangioma of skin and subcutaneous tissue: Secondary | ICD-10-CM | POA: Diagnosis not present

## 2017-03-12 DIAGNOSIS — D225 Melanocytic nevi of trunk: Secondary | ICD-10-CM | POA: Diagnosis not present

## 2017-03-12 DIAGNOSIS — L853 Xerosis cutis: Secondary | ICD-10-CM | POA: Diagnosis not present

## 2017-03-12 DIAGNOSIS — L821 Other seborrheic keratosis: Secondary | ICD-10-CM | POA: Diagnosis not present

## 2017-03-12 DIAGNOSIS — L820 Inflamed seborrheic keratosis: Secondary | ICD-10-CM | POA: Diagnosis not present

## 2017-03-26 DIAGNOSIS — E538 Deficiency of other specified B group vitamins: Secondary | ICD-10-CM | POA: Diagnosis not present

## 2017-04-01 NOTE — H&P (Signed)
TOTAL KNEE ADMISSION H&P  Patient is being admitted for left total knee arthroplasty.  Subjective:  Chief Complaint:left knee pain.  HPI: Shawna Ellison, 72 y.o. female, has a history of pain and functional disability in the left knee due to arthritis and has failed non-surgical conservative treatments for greater than 12 weeks to includecorticosteriod injections, viscosupplementation injections, supervised PT with diminished ADL's post treatment, weight reduction as appropriate and activity modification.  Onset of symptoms was gradual, starting 3 years ago with gradually worsening course since that time. The patient noted no past surgery on the left knee(s).  Patient currently rates pain in the left knee(s) at 6 out of 10 with activity. Patient has worsening of pain with activity and weight bearing, pain that interferes with activities of daily living and pain with passive range of motion.  Patient has evidence of subchondral sclerosis and joint space narrowing by imaging studies. There is no active infection.  Patient Active Problem List   Diagnosis Date Noted  . Osteoarthritis of right knee 02/23/2015  . Morbid obesity ([place]) 11/13/2014  . Vitamin B12 deficiency (non anemic) 05/10/2014  . 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 Hyperlipidemia with target LDL less than 130 03/09/2014  . Vitamin D insufficiency 03/11/2013  . Hyperglycemia 03/10/2013  . SUI (stress urinary incontinence, female) 03/10/2013  . Other screening mammogram 03/10/2013  . Osteopenia 03/10/2013  . Routine general medical examination at a health care facility 03/10/2013  . Obesity (BMI 35.0-39.9 without comorbidity) 03/12/2012  . Bariatric surgery status 03/12/2012  . Essential hypertension, benign 03/12/2012   Past Medical History:  Diagnosis Date  . Arthritis   . Complication of anesthesia    slow to awaken after some surgeries  . Hypertension   . Migraine    hx of    Past Surgical History:  Procedure  Laterality Date  . CARPAL TUNNEL RELEASE Bilateral   . cartlidge removed from right knee    . cyst removed from right knee    . cyst removed from right wrist    . GASTRIC ROUX-EN-Y N/A 11/13/2014   Procedure: LAPAROSCOPIC ROUX-EN-Y GASTRIC;  Surgeon: Alphonsa Overall, MD;  Location: [facility];  Service: General;  Laterality: N/A;  . LapBand Surgery    . lateral release on left wrist    . right thumb trigrrrer finger release    . TOTAL KNEE ARTHROPLASTY Right 02/23/2015   Procedure: RIGHT TOTAL KNEE ARTHROPLASTY;  Surgeon: Sydnee Cabal, MD;  Location: [facility];  Service: Orthopedics;  Laterality: Right;  . TUBAL LIGATION  1975    No current facility-administered medications for this encounter.    Current Outpatient Medications  Medication Sig Dispense Refill Last Dose  . aspirin EC 81 MG tablet Take 81 mg by mouth daily.     Marland Kitchen buPROPion (WELLBUTRIN XL) 150 MG 24 hr tablet Take 150 mg by mouth daily.     . Cholecalciferol (VITAMIN D3 PO) Take 1 capsule by mouth daily.     . cyanocobalamin (,VITAMIN B-12,) 1000 MCG/ML injection Inject 1,000 mcg into the muscle every 30 (thirty) days.     . Multiple Vitamin (MULTIVITAMIN WITH MINERALS) TABS tablet Take 1 tablet by mouth daily.   Taking  . naproxen sodium (ALEVE) 220 MG tablet Take 220 mg by mouth at bedtime as needed (for pain).     . methocarbamol (ROBAXIN) 500 MG tablet Take 1 tablet (500 mg total) by mouth every 8 (eight) hours as needed for muscle spasms. (Patient not taking: Reported on  03/31/2017) 50 tablet 2 Completed Course at Unknown time  . valsartan (DIOVAN) 160 MG tablet TAKE ONE TABLET BY MOUTH ONCE DAILY (Patient not taking: Reported on 03/31/2017) 90 tablet 1 Not Taking at Unknown time   No Known Allergies  Social History   Tobacco Use  . Smoking status: Never Smoker  . Smokeless tobacco: Never Used  Substance Use Topics  . Alcohol use: Yes    Comment: rarely    Family History  Problem Relation Age of Onset  . Arthritis Other    . Hyperlipidemia Other   . Hypertension Other   . Diabetes Other   . Cancer Other        overian and breast  . Stroke Neg Hx   . Kidney disease Neg Hx   . Heart disease Neg Hx   . Early death Neg Hx   . Drug abuse Neg Hx   . Alcohol abuse Neg Hx   . COPD Father      Review of Systems  Constitutional: Negative.   HENT: Negative.   Eyes: Negative.   Respiratory: Negative.   Cardiovascular: Negative.   Gastrointestinal: Negative.   Genitourinary: Negative.   Musculoskeletal: Positive for joint pain.  Skin: Negative.   Neurological: Negative.   Endo/Heme/Allergies: Negative.   Psychiatric/Behavioral: Negative.     Objective:  Physical Exam  Constitutional: She is oriented to person, place, and time. She appears well-developed.  HENT:  Head: Normocephalic.  Eyes: EOM are normal.  Neck: Normal range of motion.  Cardiovascular: Normal rate and intact distal pulses.  Respiratory: Effort normal.  GI: Soft.  Genitourinary:  Genitourinary Comments: Deferred  Musculoskeletal:  Left knee medial pain. Knee is stable. LLE grossly n/v intact.  Neurological: She is alert and oriented to person, place, and time. She has normal reflexes.  Skin: Skin is warm and dry.  Psychiatric: Her behavior is normal.    Vital signs in last 24 hours: BP: ()/()  Arterial Line BP: ()/()   Labs:   Estimated body mass index is 32.42 kg/m as calculated from the following:   Height as of 03/13/15: 5\' 3"  (1.6 m).   Weight as of 03/13/15: 83 kg (183 lb).   Imaging Review Plain radiographs demonstrate moderate degenerative joint disease of the left knee(s). The overall alignment ismild varus. The bone quality appears to be good for age and reported activity level.  Assessment/Plan:  End stage arthritis, left knee   The patient history, physical examination, clinical judgment of the provider and imaging studies are consistent with end stage degenerative joint disease of the left knee(s)  and total knee arthroplasty is deemed medically necessary. The treatment options including medical management, injection therapy arthroscopy and arthroplasty were discussed at length. The risks and benefits of total knee arthroplasty were presented and reviewed. The risks due to aseptic loosening, infection, stiffness, patella tracking problems, thromboembolic complications and other imponderables were discussed. The patient acknowledged the explanation, agreed to proceed with the plan and consent was signed. Patient is being admitted for inpatient treatment for surgery, pain control, PT, OT, prophylactic antibiotics, VTE prophylaxis, progressive ambulation and ADL's and discharge planning. The patient is planning to be discharged home with home health services.  Will use IV tranexamic acid. Contraindications and adverse affects of Tranexamic acid discussed in detail. Patient denies any of these at this time and understands the risks and benefits.

## 2017-04-01 NOTE — H&P (View-Only) (Signed)
TOTAL KNEE ADMISSION H&P  Patient is being admitted for left total knee arthroplasty.  Subjective:  Chief Complaint:left knee pain.  HPI: Shawna Ellison, 72 y.o. female, has a history of pain and functional disability in the left knee due to arthritis and has failed non-surgical conservative treatments for greater than 12 weeks to includecorticosteriod injections, viscosupplementation injections, supervised PT with diminished ADL's post treatment, weight reduction as appropriate and activity modification.  Onset of symptoms was gradual, starting 3 years ago with gradually worsening course since that time. The patient noted no past surgery on the left knee(s).  Patient currently rates pain in the left knee(s) at 6 out of 10 with activity. Patient has worsening of pain with activity and weight bearing, pain that interferes with activities of daily living and pain with passive range of motion.  Patient has evidence of subchondral sclerosis and joint space narrowing by imaging studies. There is no active infection.  Patient Active Problem List   Diagnosis Date Noted  . Osteoarthritis of right knee 02/23/2015  . Morbid obesity ([place]) 11/13/2014  . Vitamin B12 deficiency (non anemic) 05/10/2014  . 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 Hyperlipidemia with target LDL less than 130 03/09/2014  . Vitamin D insufficiency 03/11/2013  . Hyperglycemia 03/10/2013  . SUI (stress urinary incontinence, female) 03/10/2013  . Other screening mammogram 03/10/2013  . Osteopenia 03/10/2013  . Routine general medical examination at a health care facility 03/10/2013  . Obesity (BMI 35.0-39.9 without comorbidity) 03/12/2012  . Bariatric surgery status 03/12/2012  . Essential hypertension, benign 03/12/2012   Past Medical History:  Diagnosis Date  . Arthritis   . Complication of anesthesia    slow to awaken after some surgeries  . Hypertension   . Migraine    hx of    Past Surgical History:  Procedure  Laterality Date  . CARPAL TUNNEL RELEASE Bilateral   . cartlidge removed from right knee    . cyst removed from right knee    . cyst removed from right wrist    . GASTRIC ROUX-EN-Y N/A 11/13/2014   Procedure: LAPAROSCOPIC ROUX-EN-Y GASTRIC;  Surgeon: Alphonsa Overall, MD;  Location: [facility];  Service: General;  Laterality: N/A;  . LapBand Surgery    . lateral release on left wrist    . right thumb trigrrrer finger release    . TOTAL KNEE ARTHROPLASTY Right 02/23/2015   Procedure: RIGHT TOTAL KNEE ARTHROPLASTY;  Surgeon: Sydnee Cabal, MD;  Location: [facility];  Service: Orthopedics;  Laterality: Right;  . TUBAL LIGATION  1975    No current facility-administered medications for this encounter.    Current Outpatient Medications  Medication Sig Dispense Refill Last Dose  . aspirin EC 81 MG tablet Take 81 mg by mouth daily.     Marland Kitchen buPROPion (WELLBUTRIN XL) 150 MG 24 hr tablet Take 150 mg by mouth daily.     . Cholecalciferol (VITAMIN D3 PO) Take 1 capsule by mouth daily.     . cyanocobalamin (,VITAMIN B-12,) 1000 MCG/ML injection Inject 1,000 mcg into the muscle every 30 (thirty) days.     . Multiple Vitamin (MULTIVITAMIN WITH MINERALS) TABS tablet Take 1 tablet by mouth daily.   Taking  . naproxen sodium (ALEVE) 220 MG tablet Take 220 mg by mouth at bedtime as needed (for pain).     . methocarbamol (ROBAXIN) 500 MG tablet Take 1 tablet (500 mg total) by mouth every 8 (eight) hours as needed for muscle spasms. (Patient not taking: Reported on  03/31/2017) 50 tablet 2 Completed Course at Unknown time  . valsartan (DIOVAN) 160 MG tablet TAKE ONE TABLET BY MOUTH ONCE DAILY (Patient not taking: Reported on 03/31/2017) 90 tablet 1 Not Taking at Unknown time   No Known Allergies  Social History   Tobacco Use  . Smoking status: Never Smoker  . Smokeless tobacco: Never Used  Substance Use Topics  . Alcohol use: Yes    Comment: rarely    Family History  Problem Relation Age of Onset  . Arthritis Other    . Hyperlipidemia Other   . Hypertension Other   . Diabetes Other   . Cancer Other        overian and breast  . Stroke Neg Hx   . Kidney disease Neg Hx   . Heart disease Neg Hx   . Early death Neg Hx   . Drug abuse Neg Hx   . Alcohol abuse Neg Hx   . COPD Father      Review of Systems  Constitutional: Negative.   HENT: Negative.   Eyes: Negative.   Respiratory: Negative.   Cardiovascular: Negative.   Gastrointestinal: Negative.   Genitourinary: Negative.   Musculoskeletal: Positive for joint pain.  Skin: Negative.   Neurological: Negative.   Endo/Heme/Allergies: Negative.   Psychiatric/Behavioral: Negative.     Objective:  Physical Exam  Constitutional: She is oriented to person, place, and time. She appears well-developed.  HENT:  Head: Normocephalic.  Eyes: EOM are normal.  Neck: Normal range of motion.  Cardiovascular: Normal rate and intact distal pulses.  Respiratory: Effort normal.  GI: Soft.  Genitourinary:  Genitourinary Comments: Deferred  Musculoskeletal:  Left knee medial pain. Knee is stable. LLE grossly n/v intact.  Neurological: She is alert and oriented to person, place, and time. She has normal reflexes.  Skin: Skin is warm and dry.  Psychiatric: Her behavior is normal.    Vital signs in last 24 hours: BP: ()/()  Arterial Line BP: ()/()   Labs:   Estimated body mass index is 32.42 kg/m as calculated from the following:   Height as of 03/13/15: 5\' 3"  (1.6 m).   Weight as of 03/13/15: 83 kg (183 lb).   Imaging Review Plain radiographs demonstrate moderate degenerative joint disease of the left knee(s). The overall alignment ismild varus. The bone quality appears to be good for age and reported activity level.  Assessment/Plan:  End stage arthritis, left knee   The patient history, physical examination, clinical judgment of the provider and imaging studies are consistent with end stage degenerative joint disease of the left knee(s)  and total knee arthroplasty is deemed medically necessary. The treatment options including medical management, injection therapy arthroscopy and arthroplasty were discussed at length. The risks and benefits of total knee arthroplasty were presented and reviewed. The risks due to aseptic loosening, infection, stiffness, patella tracking problems, thromboembolic complications and other imponderables were discussed. The patient acknowledged the explanation, agreed to proceed with the plan and consent was signed. Patient is being admitted for inpatient treatment for surgery, pain control, PT, OT, prophylactic antibiotics, VTE prophylaxis, progressive ambulation and ADL's and discharge planning. The patient is planning to be discharged home with home health services.  Will use IV tranexamic acid. Contraindications and adverse affects of Tranexamic acid discussed in detail. Patient denies any of these at this time and understands the risks and benefits.

## 2017-04-09 NOTE — Patient Instructions (Signed)
Shawna Ellison  04/09/2017   Your procedure is scheduled on: 04/17/2017   Report to [facility] Main  Entrance  Report to admitting at    115pm   Call this number if you have problems the morning of surgery [phone]   Remember: Do not eat food after midnite .  May have clear liquids from 12 midnite util 0930am morning of surgery then nothing by mouth.       Take these medicines the morning of surgery with A SIP OF WATER: WEllbutrin                                 You may not have any metal on your body including hair pins and              piercings  Do not wear jewelry, make-up, lotions, powders or perfumes, deodorant             Do not wear nail polish.  Do not shave  48 hours prior to surgery.     Do not bring valuables to the hospital. [place].  Contacts, dentures or bridgework may not be worn into surgery.  Leave suitcase in the car. After surgery it may be brought to your room.                       Please read over the following fact sheets you were given: _____________________________________________________________________                CLEAR LIQUID DIET   Foods Allowed                                                                     Foods Excluded  Coffee and tea, regular and decaf                             liquids that you cannot  Plain Jell-O in any flavor                                             see through such as: Fruit ices (not with fruit pulp)                                     milk, soups, orange juice  Iced Popsicles                                    All solid food Carbonated beverages, regular and diet  Cranberry, grape and apple juices Sports drinks like Gatorade Lightly seasoned clear broth or consume(fat free) Sugar, honey syrup  Sample Menu Breakfast                                Lunch                                      Supper Cranberry juice                    Beef broth                            Chicken broth Jell-O                                     Grape juice                           Apple juice Coffee or tea                        Jell-O                                      Popsicle                                                Coffee or tea                        Coffee or tea  _____________________________________________________________________  [facility] - Preparing for Surgery Before surgery, you can play an important role.  Because skin is not sterile, your skin needs to be as free of germs as possible.  You can reduce the number of germs on your skin by washing with CHG (chlorahexidine gluconate) soap before surgery.  CHG is an antiseptic cleaner which kills germs and bonds with the skin to continue killing germs even after washing. Please DO NOT use if you have an allergy to CHG or antibacterial soaps.  If your skin becomes reddened/irritated stop using the CHG and inform your nurse when you arrive at Short Stay. Do not shave (including legs and underarms) for at least 48 hours prior to the first CHG shower.  You may shave your face/neck. Please follow these instructions carefully:  1.  Shower with CHG Soap the night before surgery and the  morning of Surgery.  2.  If you choose to wash your hair, wash your hair first as usual with your  normal  shampoo.  3.  After you shampoo, rinse your hair and body thoroughly to remove the  shampoo.                           4.  Use CHG as you would any other liquid soap.  You can apply chg directly  to the skin and wash  Gently with a scrungie or clean washcloth.  5.  Apply the CHG Soap to your body ONLY FROM THE NECK DOWN.   Do not use on face/ open                           Wound or open sores. Avoid contact with eyes, ears mouth and genitals (private parts).                       Wash face,  Genitals (private parts) with your  normal soap.             6.  Wash thoroughly, paying special attention to the area where your surgery  will be performed.  7.  Thoroughly rinse your body with warm water from the neck down.  8.  DO NOT shower/wash with your normal soap after using and rinsing off  the CHG Soap.                9.  Pat yourself dry with a clean towel.            10.  Wear clean pajamas.            11.  Place clean sheets on your bed the night of your first shower and do not  sleep with pets. Day of Surgery : Do not apply any lotions/deodorants the morning of surgery.  Please wear clean clothes to the hospital/surgery center.  FAILURE TO FOLLOW THESE INSTRUCTIONS MAY RESULT IN THE CANCELLATION OF YOUR SURGERY PATIENT SIGNATURE_________________________________  NURSE SIGNATURE__________________________________  ________________________________________________________________________  WHAT IS A BLOOD TRANSFUSION? Blood Transfusion Information  A transfusion is the replacement of blood or some of its parts. Blood is made up of multiple cells which provide different functions.  Red blood cells carry oxygen and are used for blood loss replacement.  White blood cells fight against infection.  Platelets control bleeding.  Plasma helps clot blood.  Other blood products are available for specialized needs, such as hemophilia or other clotting disorders. BEFORE THE TRANSFUSION  Who gives blood for transfusions?   Healthy volunteers who are fully evaluated to make sure their blood is safe. This is blood bank blood. Transfusion therapy is the safest it has ever been in the practice of medicine. Before blood is taken from a donor, a complete history is taken to make sure that person has no history of diseases nor engages in risky social behavior (examples are intravenous drug use or sexual activity with multiple partners). The donor's travel history is screened to minimize risk of transmitting infections, such as  malaria. The donated blood is tested for signs of infectious diseases, such as HIV and hepatitis. The blood is then tested to be sure it is compatible with you in order to minimize the chance of a transfusion reaction. If you or a relative donates blood, this is often done in anticipation of surgery and is not appropriate for emergency situations. It takes many days to process the donated blood. RISKS AND COMPLICATIONS Although transfusion therapy is very safe and saves many lives, the main dangers of transfusion include:   Getting an infectious disease.  Developing a transfusion reaction. This is an allergic reaction to something in the blood you were given. Every precaution is taken to prevent this. The decision to have a blood transfusion has been considered carefully by your caregiver before blood is given. Blood is not given unless the benefits  outweigh the risks. AFTER THE TRANSFUSION  Right after receiving a blood transfusion, you will usually feel much better and more energetic. This is especially true if your red blood cells have gotten low (anemic). The transfusion raises the level of the red blood cells which carry oxygen, and this usually causes an energy increase.  The nurse administering the transfusion will monitor you carefully for complications. HOME CARE INSTRUCTIONS  No special instructions are needed after a transfusion. You may find your energy is better. Speak with your caregiver about any limitations on activity for underlying diseases you may have. SEEK MEDICAL CARE IF:   Your condition is not improving after your transfusion.  You develop redness or irritation at the intravenous (IV) site. SEEK IMMEDIATE MEDICAL CARE IF:  Any of the following symptoms occur over the next 12 hours:  Shaking chills.  You have a temperature by mouth above 102 F (38.9 C), not controlled by medicine.  Chest, back, or muscle pain.  People around you feel you are not acting correctly  or are confused.  Shortness of breath or difficulty breathing.  Dizziness and fainting.  You get a rash or develop hives.  You have a decrease in urine output.  Your urine turns a dark color or changes to pink, red, or brown. Any of the following symptoms occur over the next 10 days:  You have a temperature by mouth above 102 F (38.9 C), not controlled by medicine.  Shortness of breath.  Weakness after normal activity.  The white part of the eye turns yellow (jaundice).  You have a decrease in the amount of urine or are urinating less often.  Your urine turns a dark color or changes to pink, red, or brown. Document Released: 03/07/2000 Document Revised: 06/02/2011 Document Reviewed: 10/25/2007 Exit[employer] Patient Information 2014 [place].  _______________________________________________________________________  Incentive Spirometer  An incentive spirometer is a tool that can help keep your lungs clear and active. This tool measures how well you are filling your lungs with each breath. Taking long deep breaths may help reverse or decrease the chance of developing breathing (pulmonary) problems (especially infection) following:  A long period of time when you are unable to move or be active. BEFORE THE PROCEDURE   If the spirometer includes an indicator to show your best effort, your nurse or respiratory therapist will set it to a desired goal.  If possible, sit up straight or lean slightly forward. Try not to slouch.  Hold the incentive spirometer in an upright position. INSTRUCTIONS FOR USE  1. Sit on the edge of your bed if possible, or sit up as far as you can in bed or on a chair. 2. Hold the incentive spirometer in an upright position. 3. Breathe out normally. 4. Place the mouthpiece in your mouth and seal your lips tightly around it. 5. Breathe in slowly and as deeply as possible, raising the piston or the ball toward the top of the column. 6. Hold your  breath for 3-5 seconds or for as long as possible. Allow the piston or ball to fall to the bottom of the column. 7. Remove the mouthpiece from your mouth and breathe out normally. 8. Rest for a few seconds and repeat Steps 1 through 7 at least 10 times every 1-2 hours when you are awake. Take your time and take a few normal breaths between deep breaths. 9. The spirometer may include an indicator to show your best effort. Use the indicator as a goal to  work toward during each repetition. 10. After each set of 10 deep breaths, practice coughing to be sure your lungs are clear. If you have an incision (the cut made at the time of surgery), support your incision when coughing by placing a pillow or rolled up towels firmly against it. Once you are able to get out of bed, walk around indoors and cough well. You may stop using the incentive spirometer when instructed by your caregiver.  RISKS AND COMPLICATIONS  Take your time so you do not get dizzy or light-headed.  If you are in pain, you may need to take or ask for pain medication before doing incentive spirometry. It is harder to take a deep breath if you are having pain. AFTER USE  Rest and breathe slowly and easily.  It can be helpful to keep track of a log of your progress. Your caregiver can provide you with a simple table to help with this. If you are using the spirometer at home, follow these instructions: [place] IF:   You are having difficultly using the spirometer.  You have trouble using the spirometer as often as instructed.  Your pain medication is not giving enough relief while using the spirometer.  You develop fever of 100.5 F (38.1 C) or higher. SEEK IMMEDIATE MEDICAL CARE IF:   You cough up bloody sputum that had not been present before.  You develop fever of 102 F (38.9 C) or greater.  You develop worsening pain at or near the incision site. MAKE SURE YOU:   Understand these instructions.  Will watch  your condition.  Will get help right away if you are not doing well or get worse. Document Released: 07/21/2006 Document Revised: 06/02/2011 Document Reviewed: 09/21/2006 [facility] Patient Information 2014 [place], [place].   ________________________________________________________________________

## 2017-04-10 ENCOUNTER — Encounter (HOSPITAL_COMMUNITY)
Admission: RE | Admit: 2017-04-10 | Discharge: 2017-04-10 | Disposition: A | Discharge status: Home / Self Care | Payer: Medicare Other | Source: Ambulatory Visit | Attending: Specialist | Admitting: Specialist

## 2017-04-10 ENCOUNTER — Other Ambulatory Visit: Payer: Self-pay

## 2017-04-10 ENCOUNTER — Encounter (HOSPITAL_COMMUNITY): Payer: Self-pay

## 2017-04-10 DIAGNOSIS — Z01812 Encounter for preprocedural laboratory examination: Secondary | ICD-10-CM | POA: Diagnosis not present

## 2017-04-10 DIAGNOSIS — M1712 Unilateral primary osteoarthritis, left knee: Secondary | ICD-10-CM | POA: Insufficient documentation

## 2017-04-10 DIAGNOSIS — Z01810 Encounter for preprocedural cardiovascular examination: Secondary | ICD-10-CM | POA: Insufficient documentation

## 2017-04-10 LAB — BASIC METABOLIC PANEL
Anion gap: 4 — ABNORMAL LOW (ref 5–15)
BUN: 15 mg/dL (ref 6–20)
CALCIUM: 8.6 mg/dL — AB (ref 8.9–10.3)
CO2: 28 mmol/L (ref 22–32)
CREATININE: 0.55 mg/dL (ref 0.44–1.00)
Chloride: 108 mmol/L (ref 101–111)
GFR calc Af Amer: >60 mL/min (ref >60)
Glucose, Bld: 84 mg/dL (ref 65–99)
Potassium: 3.4 mmol/L — ABNORMAL LOW (ref 3.5–5.1)
Sodium: 140 mmol/L (ref 135–145)

## 2017-04-10 LAB — SURGICAL PCR SCREEN
MRSA, PCR: NEGATIVE
Staphylococcus aureus: POSITIVE — AB

## 2017-04-10 LAB — URINALYSIS, ROUTINE W REFLEX MICROSCOPIC
Bilirubin Urine: NEGATIVE
Glucose, UA: NEGATIVE mg/dL
HGB URINE DIPSTICK: NEGATIVE
Ketones, ur: NEGATIVE mg/dL
Leukocytes, UA: NEGATIVE
Nitrite: NEGATIVE
Protein, ur: NEGATIVE mg/dL
SPECIFIC GRAVITY, URINE: 1.012 (ref 1.005–1.030)
pH: 5 (ref 5.0–8.0)

## 2017-04-10 LAB — CBC
HCT: 37.2 % (ref 36.0–46.0)
Hemoglobin: 11.9 g/dL — ABNORMAL LOW (ref 12.0–15.0)
MCH: 31 pg (ref 26.0–34.0)
MCHC: 32 g/dL (ref 30.0–36.0)
MCV: 96.9 fL (ref 78.0–100.0)
PLATELETS: 240 10*3/uL (ref 150–400)
RBC: 3.84 MIL/uL — ABNORMAL LOW (ref 3.87–5.11)
RDW: 13 % (ref 11.5–15.5)
WBC: 4.2 10*3/uL (ref 4.0–10.5)

## 2017-04-10 LAB — PROTIME-INR
INR: 1.02
Prothrombin Time: 13.3 seconds (ref 11.4–15.2)

## 2017-04-10 LAB — APTT: APTT: 30 s (ref 24–36)

## 2017-04-10 NOTE — Progress Notes (Signed)
Final EKG done 04/10/17-epic

## 2017-04-10 NOTE — Progress Notes (Signed)
Claud Kelp FNP - 01/05/17- clearance on chart

## 2017-04-11 LAB — ABO/RH: ABO/RH(D): A POS

## 2017-04-17 ENCOUNTER — Encounter (HOSPITAL_COMMUNITY): Admission: RE | Payer: Self-pay | Source: Ambulatory Visit

## 2017-04-17 ENCOUNTER — Inpatient Hospital Stay (HOSPITAL_COMMUNITY): Admission: RE | Admit: 2017-04-17 | Payer: Medicare Other | Source: Ambulatory Visit | Admitting: Specialist

## 2017-04-17 LAB — TYPE AND SCREEN
ABO/RH(D): A POS
Antibody Screen: NEGATIVE

## 2017-04-17 SURGERY — ARTHROPLASTY, KNEE, TOTAL
Anesthesia: Spinal | Site: Knee | Laterality: Left

## 2017-04-24 NOTE — Progress Notes (Signed)
Called patient with surgery date 04-30-17 npo after midnight food, clear liquids midnight until 700 am, then npo, take bupriopion  In am, reviewed hibiclens showers hs and am  Arrive 1030 am 04-30-17 [facility] admitting, patient vocalized understanding all instructions.

## 2017-04-29 ENCOUNTER — Ambulatory Visit: Payer: Self-pay | Admitting: Orthopedic Surgery

## 2017-04-29 DIAGNOSIS — E538 Deficiency of other specified B group vitamins: Secondary | ICD-10-CM | POA: Diagnosis not present

## 2017-04-30 ENCOUNTER — Inpatient Hospital Stay (HOSPITAL_COMMUNITY)
Admission: RE | Admit: 2017-04-30 | Discharge: 2017-05-02 | DRG: 470 | Disposition: A | Discharge status: Home Health | Payer: Medicare Other | Source: Ambulatory Visit | Attending: Specialist | Admitting: Specialist

## 2017-04-30 ENCOUNTER — Other Ambulatory Visit: Payer: Self-pay

## 2017-04-30 ENCOUNTER — Encounter (HOSPITAL_COMMUNITY)
Admission: RE | Disposition: A | Discharge status: Home Health | Payer: Self-pay | Source: Ambulatory Visit | Attending: Specialist

## 2017-04-30 ENCOUNTER — Encounter (HOSPITAL_COMMUNITY): Payer: Self-pay | Admitting: *Deleted

## 2017-04-30 ENCOUNTER — Inpatient Hospital Stay (HOSPITAL_COMMUNITY): Payer: Medicare Other | Admitting: Certified Registered Nurse Anesthetist

## 2017-04-30 DIAGNOSIS — Z96651 Presence of right artificial knee joint: Secondary | ICD-10-CM | POA: Diagnosis not present

## 2017-04-30 DIAGNOSIS — E559 Vitamin D deficiency, unspecified: Secondary | ICD-10-CM | POA: Diagnosis not present

## 2017-04-30 DIAGNOSIS — Z7982 Long term (current) use of aspirin: Secondary | ICD-10-CM

## 2017-04-30 DIAGNOSIS — I10 Essential (primary) hypertension: Secondary | ICD-10-CM | POA: Diagnosis not present

## 2017-04-30 DIAGNOSIS — F329 Major depressive disorder, single episode, unspecified: Secondary | ICD-10-CM | POA: Diagnosis not present

## 2017-04-30 DIAGNOSIS — E538 Deficiency of other specified B group vitamins: Secondary | ICD-10-CM | POA: Diagnosis not present

## 2017-04-30 DIAGNOSIS — Z9884 Bariatric surgery status: Secondary | ICD-10-CM | POA: Diagnosis not present

## 2017-04-30 DIAGNOSIS — M1712 Unilateral primary osteoarthritis, left knee: Secondary | ICD-10-CM | POA: Diagnosis not present

## 2017-04-30 DIAGNOSIS — Z9851 Tubal ligation status: Secondary | ICD-10-CM | POA: Diagnosis not present

## 2017-04-30 DIAGNOSIS — Z79899 Other long term (current) drug therapy: Secondary | ICD-10-CM

## 2017-04-30 DIAGNOSIS — G8918 Other acute postprocedural pain: Secondary | ICD-10-CM | POA: Diagnosis not present

## 2017-04-30 DIAGNOSIS — Z8249 Family history of ischemic heart disease and other diseases of the circulatory system: Secondary | ICD-10-CM

## 2017-04-30 DIAGNOSIS — Z96659 Presence of unspecified artificial knee joint: Secondary | ICD-10-CM

## 2017-04-30 HISTORY — PX: TOTAL KNEE ARTHROPLASTY: SHX125

## 2017-04-30 LAB — BASIC METABOLIC PANEL
ANION GAP: 6 (ref 5–15)
BUN: 12 mg/dL (ref 6–20)
CHLORIDE: 107 mmol/L (ref 101–111)
CO2: 29 mmol/L (ref 22–32)
Calcium: 8.9 mg/dL (ref 8.9–10.3)
Creatinine, Ser: 0.54 mg/dL (ref 0.44–1.00)
GFR calc non Af Amer: >60 mL/min (ref >60)
Glucose, Bld: 94 mg/dL (ref 65–99)
Potassium: 3.3 mmol/L — ABNORMAL LOW (ref 3.5–5.1)
Sodium: 142 mmol/L (ref 135–145)

## 2017-04-30 LAB — TYPE AND SCREEN
ABO/RH(D): A POS
ANTIBODY SCREEN: NEGATIVE

## 2017-04-30 LAB — CBC
HCT: 38.5 % (ref 36.0–46.0)
HEMOGLOBIN: 12.4 g/dL (ref 12.0–15.0)
MCH: 30.8 pg (ref 26.0–34.0)
MCHC: 32.2 g/dL (ref 30.0–36.0)
MCV: 95.8 fL (ref 78.0–100.0)
Platelets: 243 10*3/uL (ref 150–400)
RBC: 4.02 MIL/uL (ref 3.87–5.11)
RDW: 12.8 % (ref 11.5–15.5)
WBC: 4.3 10*3/uL (ref 4.0–10.5)

## 2017-04-30 LAB — PROTIME-INR
INR: 1.01
PROTHROMBIN TIME: 13.2 s (ref 11.4–15.2)

## 2017-04-30 LAB — APTT: aPTT: 29 seconds (ref 24–36)

## 2017-04-30 SURGERY — ARTHROPLASTY, KNEE, TOTAL
Anesthesia: Spinal | Site: Knee | Laterality: Left

## 2017-04-30 MED ORDER — DEXAMETHASONE SODIUM PHOSPHATE 10 MG/ML IJ SOLN
10.0000 mg | Freq: Once | INTRAMUSCULAR | Status: AC
  Administered 2017-05-01: 11:00:00 10 mg via INTRAVENOUS
  Filled 2017-04-30: qty 1

## 2017-04-30 MED ORDER — CEFAZOLIN SODIUM-DEXTROSE 2-4 GM/100ML-% IV SOLN: 2.0000 g | INTRAVENOUS | Status: DC

## 2017-04-30 MED ORDER — SODIUM CHLORIDE 0.9 % IV SOLN
INTRAVENOUS | Status: DC
  Administered 2017-04-30 – 2017-05-01 (×2): via INTRAVENOUS

## 2017-04-30 MED ORDER — IRBESARTAN 150 MG PO TABS
150.0000 mg | ORAL_TABLET | Freq: Every day | ORAL | Status: DC
  Administered 2017-05-01: 150 mg via ORAL
  Filled 2017-04-30 (×2): qty 1

## 2017-04-30 MED ORDER — BISACODYL 5 MG PO TBEC: 5.0000 mg | DELAYED_RELEASE_TABLET | Freq: Every day | ORAL | Status: DC | PRN

## 2017-04-30 MED ORDER — MAGNESIUM CITRATE PO SOLN: 1.0000 | Freq: Once | ORAL | Status: DC | PRN

## 2017-04-30 MED ORDER — SODIUM CHLORIDE 0.9 % IR SOLN
Status: DC | PRN
  Administered 2017-04-30: 1000 mL

## 2017-04-30 MED ORDER — MEPERIDINE HCL 50 MG/ML IJ SOLN: 6.2500 mg | INTRAMUSCULAR | Status: DC | PRN

## 2017-04-30 MED ORDER — BUPIVACAINE-EPINEPHRINE 0.25% -1:200000 IJ SOLN
INTRAMUSCULAR | Status: DC | PRN
  Administered 2017-04-30: 30 mL

## 2017-04-30 MED ORDER — ONDANSETRON HCL 4 MG/2ML IJ SOLN
4.0000 mg | Freq: Four times a day (QID) | INTRAMUSCULAR | Status: DC | PRN
  Administered 2017-04-30 – 2017-05-01 (×2): 4 mg via INTRAVENOUS
  Filled 2017-04-30 (×2): qty 2

## 2017-04-30 MED ORDER — ONDANSETRON HCL 4 MG/2ML IJ SOLN: 4.0000 mg | Freq: Once | INTRAMUSCULAR | Status: DC | PRN

## 2017-04-30 MED ORDER — CEFAZOLIN SODIUM-DEXTROSE 2-4 GM/100ML-% IV SOLN
2.0000 g | Freq: Four times a day (QID) | INTRAVENOUS | Status: AC
  Administered 2017-04-30 (×2): 2 g via INTRAVENOUS
  Filled 2017-04-30 (×2): qty 100

## 2017-04-30 MED ORDER — ASPIRIN EC 325 MG PO TBEC: 325.0000 mg | DELAYED_RELEASE_TABLET | Freq: Two times a day (BID) | ORAL | 0 refills | Status: AC

## 2017-04-30 MED ORDER — POLYETHYLENE GLYCOL 3350 17 G PO PACK: 17.0000 g | PACK | Freq: Every day | ORAL | Status: DC | PRN

## 2017-04-30 MED ORDER — PROPOFOL 500 MG/50ML IV EMUL
INTRAVENOUS | Status: DC | PRN
  Administered 2017-04-30: 50 ug/kg/min via INTRAVENOUS

## 2017-04-30 MED ORDER — SODIUM CHLORIDE 0.9 % IV SOLN: INTRAVENOUS | Status: DC

## 2017-04-30 MED ORDER — MIDAZOLAM HCL 2 MG/2ML IJ SOLN
1.0000 mg | INTRAMUSCULAR | Status: AC
  Administered 2017-04-30: 2 mg via INTRAVENOUS

## 2017-04-30 MED ORDER — OXYCODONE HCL 5 MG PO TABS
5.0000 mg | ORAL_TABLET | ORAL | Status: DC | PRN
  Administered 2017-04-30 – 2017-05-01 (×2): 5 mg via ORAL
  Filled 2017-04-30 (×3): qty 1

## 2017-04-30 MED ORDER — PHENOL 1.4 % MT LIQD: 1.0000 | OROMUCOSAL | Status: DC | PRN

## 2017-04-30 MED ORDER — OXYCODONE HCL 5 MG PO TABS: 5.0000 mg | ORAL_TABLET | ORAL | 0 refills | Status: AC | PRN

## 2017-04-30 MED ORDER — LACTATED RINGERS IV SOLN
INTRAVENOUS | Status: DC
  Administered 2017-04-30 (×3): via INTRAVENOUS

## 2017-04-30 MED ORDER — SENNA 8.6 MG PO TABS
1.0000 | ORAL_TABLET | Freq: Two times a day (BID) | ORAL | Status: DC
  Administered 2017-04-30 – 2017-05-02 (×3): 8.6 mg via ORAL
  Filled 2017-04-30 (×4): qty 1

## 2017-04-30 MED ORDER — BUPROPION HCL ER (XL) 150 MG PO TB24
150.0000 mg | ORAL_TABLET | Freq: Every day | ORAL | Status: DC
  Administered 2017-05-01 – 2017-05-02 (×2): 150 mg via ORAL
  Filled 2017-04-30 (×2): qty 1

## 2017-04-30 MED ORDER — CHLORHEXIDINE GLUCONATE 4 % EX LIQD: 60.0000 mL | Freq: Once | CUTANEOUS | Status: DC

## 2017-04-30 MED ORDER — METOCLOPRAMIDE HCL 5 MG PO TABS: 5.0000 mg | ORAL_TABLET | Freq: Three times a day (TID) | ORAL | Status: DC | PRN

## 2017-04-30 MED ORDER — DIPHENHYDRAMINE HCL 12.5 MG/5ML PO ELIX: 12.5000 mg | ORAL_SOLUTION | ORAL | Status: DC | PRN

## 2017-04-30 MED ORDER — METHOCARBAMOL 1000 MG/10ML IJ SOLN
500.0000 mg | Freq: Four times a day (QID) | INTRAVENOUS | Status: DC | PRN
  Administered 2017-04-30: 500 mg via INTRAVENOUS
  Filled 2017-04-30: qty 550

## 2017-04-30 MED ORDER — PROPOFOL 10 MG/ML IV BOLUS
INTRAVENOUS | Status: DC | PRN
  Administered 2017-04-30 (×3): 20 mg via INTRAVENOUS

## 2017-04-30 MED ORDER — MIDAZOLAM HCL 2 MG/2ML IJ SOLN
INTRAMUSCULAR | Status: AC
  Administered 2017-04-30: 2 mg via INTRAVENOUS
  Filled 2017-04-30: qty 2

## 2017-04-30 MED ORDER — FENTANYL CITRATE (PF) 100 MCG/2ML IJ SOLN
50.0000 ug | INTRAMUSCULAR | Status: AC
Start: 2017-04-30 — End: 2017-04-30
  Administered 2017-04-30: 50 ug via INTRAVENOUS

## 2017-04-30 MED ORDER — KETOROLAC TROMETHAMINE 30 MG/ML IJ SOLN
INTRAMUSCULAR | Status: AC
  Filled 2017-04-30: qty 1

## 2017-04-30 MED ORDER — HYDROMORPHONE HCL 1 MG/ML IJ SOLN
INTRAMUSCULAR | Status: AC
  Filled 2017-04-30: qty 1

## 2017-04-30 MED ORDER — MENTHOL 3 MG MT LOZG: 1.0000 | LOZENGE | OROMUCOSAL | Status: DC | PRN

## 2017-04-30 MED ORDER — METHOCARBAMOL 500 MG PO TABS: 500.0000 mg | ORAL_TABLET | Freq: Three times a day (TID) | ORAL | 2 refills | Status: AC | PRN

## 2017-04-30 MED ORDER — FERROUS SULFATE 325 (65 FE) MG PO TABS
325.0000 mg | ORAL_TABLET | Freq: Three times a day (TID) | ORAL | Status: DC
  Administered 2017-05-02: 325 mg via ORAL
  Filled 2017-04-30 (×2): qty 1

## 2017-04-30 MED ORDER — BUPIVACAINE IN DEXTROSE 0.75-8.25 % IT SOLN
INTRATHECAL | Status: DC | PRN
  Administered 2017-04-30: 1.8 mL via INTRATHECAL

## 2017-04-30 MED ORDER — ALUM & MAG HYDROXIDE-SIMETH 200-200-20 MG/5ML PO SUSP: 30.0000 mL | ORAL | Status: DC | PRN

## 2017-04-30 MED ORDER — CEFAZOLIN SODIUM-DEXTROSE 2-4 GM/100ML-% IV SOLN
2.0000 g | INTRAVENOUS | Status: AC
  Administered 2017-04-30: 2 g via INTRAVENOUS
  Filled 2017-04-30: qty 100

## 2017-04-30 MED ORDER — FENTANYL CITRATE (PF) 100 MCG/2ML IJ SOLN: INTRAMUSCULAR | Status: DC | PRN

## 2017-04-30 MED ORDER — KETOROLAC TROMETHAMINE 30 MG/ML IJ SOLN
INTRAMUSCULAR | Status: DC | PRN
  Administered 2017-04-30: 30 mg

## 2017-04-30 MED ORDER — ACETAMINOPHEN 650 MG RE SUPP: 650.0000 mg | RECTAL | Status: DC | PRN

## 2017-04-30 MED ORDER — HYDROMORPHONE HCL 1 MG/ML IJ SOLN
0.2500 mg | INTRAMUSCULAR | Status: DC | PRN
  Administered 2017-04-30 (×3): 0.5 mg via INTRAVENOUS

## 2017-04-30 MED ORDER — METOCLOPRAMIDE HCL 5 MG/ML IJ SOLN
5.0000 mg | Freq: Three times a day (TID) | INTRAMUSCULAR | Status: DC | PRN
  Administered 2017-04-30 – 2017-05-01 (×2): 10 mg via INTRAVENOUS
  Filled 2017-04-30 (×2): qty 2

## 2017-04-30 MED ORDER — STERILE WATER FOR IRRIGATION IR SOLN
Status: DC | PRN
  Administered 2017-04-30: 2000 mL

## 2017-04-30 MED ORDER — PHENYLEPHRINE 40 MCG/ML (10ML) SYRINGE FOR IV PUSH (FOR BLOOD PRESSURE SUPPORT)
PREFILLED_SYRINGE | INTRAVENOUS | Status: DC | PRN
  Administered 2017-04-30 (×3): 80 ug via INTRAVENOUS

## 2017-04-30 MED ORDER — TRANEXAMIC ACID 1000 MG/10ML IV SOLN
1000.0000 mg | INTRAVENOUS | Status: AC
  Administered 2017-04-30: 1000 mg via INTRAVENOUS
  Filled 2017-04-30: qty 1100

## 2017-04-30 MED ORDER — PHENYLEPHRINE 40 MCG/ML (10ML) SYRINGE FOR IV PUSH (FOR BLOOD PRESSURE SUPPORT)
PREFILLED_SYRINGE | INTRAVENOUS | Status: AC
  Filled 2017-04-30: qty 10

## 2017-04-30 MED ORDER — ACETAMINOPHEN 325 MG PO TABS: 650.0000 mg | ORAL_TABLET | ORAL | Status: DC | PRN

## 2017-04-30 MED ORDER — BUPIVACAINE-EPINEPHRINE 0.25% -1:200000 IJ SOLN
INTRAMUSCULAR | Status: AC
  Filled 2017-04-30: qty 1

## 2017-04-30 MED ORDER — FENTANYL CITRATE (PF) 100 MCG/2ML IJ SOLN
INTRAMUSCULAR | Status: AC
  Administered 2017-04-30: 50 ug via INTRAVENOUS
  Filled 2017-04-30: qty 2

## 2017-04-30 MED ORDER — HYDROMORPHONE HCL 1 MG/ML IJ SOLN
1.0000 mg | INTRAMUSCULAR | Status: DC | PRN
  Administered 2017-05-01: 09:00:00 1 mg via INTRAVENOUS
  Filled 2017-04-30: qty 1

## 2017-04-30 MED ORDER — ONDANSETRON HCL 4 MG PO TABS: 4.0000 mg | ORAL_TABLET | Freq: Four times a day (QID) | ORAL | Status: DC | PRN

## 2017-04-30 MED ORDER — METHOCARBAMOL 500 MG PO TABS
500.0000 mg | ORAL_TABLET | Freq: Four times a day (QID) | ORAL | Status: DC | PRN
  Administered 2017-04-30 – 2017-05-02 (×3): 500 mg via ORAL
  Filled 2017-04-30 (×3): qty 1

## 2017-04-30 MED ORDER — ENOXAPARIN SODIUM 30 MG/0.3ML ~~LOC~~ SOLN
30.0000 mg | Freq: Two times a day (BID) | SUBCUTANEOUS | Status: DC
  Administered 2017-05-01 – 2017-05-02 (×3): 30 mg via SUBCUTANEOUS
  Filled 2017-04-30 (×3): qty 0.3

## 2017-04-30 MED ORDER — OXYCODONE HCL 5 MG PO TABS
10.0000 mg | ORAL_TABLET | ORAL | Status: DC | PRN
  Administered 2017-05-01 – 2017-05-02 (×9): 10 mg via ORAL
  Filled 2017-04-30 (×9): qty 2

## 2017-04-30 MED ORDER — PROPOFOL 10 MG/ML IV BOLUS
INTRAVENOUS | Status: AC
  Filled 2017-04-30: qty 60

## 2017-04-30 MED ORDER — SODIUM CHLORIDE 0.9 % IJ SOLN
INTRAMUSCULAR | Status: DC | PRN
  Administered 2017-04-30: 30 mL

## 2017-04-30 MED ORDER — SODIUM CHLORIDE 0.9 % IJ SOLN
INTRAMUSCULAR | Status: AC
  Filled 2017-04-30: qty 50

## 2017-04-30 MED ORDER — FENTANYL CITRATE (PF) 100 MCG/2ML IJ SOLN
INTRAMUSCULAR | Status: DC | PRN
  Administered 2017-04-30: 50 ug via INTRAVENOUS

## 2017-04-30 MED ORDER — DEXAMETHASONE SODIUM PHOSPHATE 10 MG/ML IJ SOLN
10.0000 mg | Freq: Once | INTRAMUSCULAR | Status: AC
  Administered 2017-04-30: 10 mg via INTRAVENOUS

## 2017-04-30 MED ORDER — ONDANSETRON HCL 4 MG/2ML IJ SOLN
INTRAMUSCULAR | Status: DC | PRN
  Administered 2017-04-30: 4 mg via INTRAVENOUS

## 2017-04-30 SURGICAL SUPPLY — 55 items
BAG DECANTER FOR FLEXI CONT (MISCELLANEOUS) IMPLANT
BAG ZIPLOCK 12X15 (MISCELLANEOUS) ×6 IMPLANT
BANDAGE ACE 4X5 VEL STRL LF (GAUZE/BANDAGES/DRESSINGS) ×3 IMPLANT
BANDAGE ACE 6X5 VEL STRL LF (GAUZE/BANDAGES/DRESSINGS) ×3 IMPLANT
BLADE SAG 18X100X1.27 (BLADE) ×3 IMPLANT
BLADE SAW SGTL 13.0X1.19X90.0M (BLADE) ×3 IMPLANT
BOWL SMART MIX CTS (DISPOSABLE) ×3 IMPLANT
CAP KNEE TOTAL 3 SIGMA ×3 IMPLANT
CEMENT HV SMART SET (Cement) ×6 IMPLANT
COVER SURGICAL LIGHT HANDLE (MISCELLANEOUS) ×3 IMPLANT
CUFF TOURN SGL QUICK 34 (TOURNIQUET CUFF) ×2
CUFF TRNQT CYL 34X4X40X1 (TOURNIQUET CUFF) ×1 IMPLANT
DECANTER SPIKE VIAL GLASS SM (MISCELLANEOUS) ×6 IMPLANT
DERMABOND ADVANCED (GAUZE/BANDAGES/DRESSINGS) ×2
DERMABOND ADVANCED .7 DNX12 (GAUZE/BANDAGES/DRESSINGS) ×1 IMPLANT
DRAPE U-SHAPE 47X51 STRL (DRAPES) ×3 IMPLANT
DRSG AQUACEL AG ADV 3.5X10 (GAUZE/BANDAGES/DRESSINGS) ×3 IMPLANT
DRSG TEGADERM 4X4.75 (GAUZE/BANDAGES/DRESSINGS) ×3 IMPLANT
DURAPREP 26ML APPLICATOR (WOUND CARE) ×6 IMPLANT
ELECT REM PT RETURN 15FT ADLT (MISCELLANEOUS) ×3 IMPLANT
EVACUATOR 1/8 PVC DRAIN (DRAIN) ×3 IMPLANT
GAUZE SPONGE 2X2 8PLY STRL LF (GAUZE/BANDAGES/DRESSINGS) ×1 IMPLANT
GLOVE BIO SURGEON STRL SZ7.5 (GLOVE) ×6 IMPLANT
GLOVE BIOGEL PI IND STRL 8 (GLOVE) ×2 IMPLANT
GLOVE BIOGEL PI INDICATOR 8 (GLOVE) ×4
GLOVE ECLIPSE 8.0 STRL XLNG CF (GLOVE) ×6 IMPLANT
GLOVE SURG ORTHO 9.0 STRL STRW (GLOVE) ×3 IMPLANT
GLOVE SURG SS PI 7.5 STRL IVOR (GLOVE) ×9 IMPLANT
GOWN STRL REUS W/TWL XL LVL3 (GOWN DISPOSABLE) ×15 IMPLANT
HANDPIECE INTERPULSE COAX TIP (DISPOSABLE) ×2
IMMOBILIZER KNEE 20 (SOFTGOODS) ×3
IMMOBILIZER KNEE 20 THIGH 36 (SOFTGOODS) ×1 IMPLANT
PACK TOTAL KNEE CUSTOM (KITS) ×3 IMPLANT
POSITIONER SURGICAL ARM (MISCELLANEOUS) ×3 IMPLANT
SET HNDPC FAN SPRY TIP SCT (DISPOSABLE) ×1 IMPLANT
SET PAD KNEE POSITIONER (MISCELLANEOUS) ×3 IMPLANT
SPONGE GAUZE 2X2 STER 10/PKG (GAUZE/BANDAGES/DRESSINGS) ×2
SPONGE LAP 18X18 X RAY DECT (DISPOSABLE) IMPLANT
SPONGE SURGIFOAM ABS GEL 100 (HEMOSTASIS) ×3 IMPLANT
STOCKINETTE 6  STRL (DRAPES) ×2
STOCKINETTE 6 STRL (DRAPES) ×1 IMPLANT
SUCTION FRAZIER HANDLE 12FR (TUBING) ×2
SUCTION TUBE FRAZIER 12FR DISP (TUBING) ×1 IMPLANT
SUT BONE WAX W31G (SUTURE) IMPLANT
SUT MNCRL AB 3-0 PS2 18 (SUTURE) ×3 IMPLANT
SUT VIC AB 1 CT1 27 (SUTURE) ×8
SUT VIC AB 1 CT1 27XBRD ANTBC (SUTURE) ×4 IMPLANT
SUT VIC AB 2-0 CT1 27 (SUTURE) ×4
SUT VIC AB 2-0 CT1 TAPERPNT 27 (SUTURE) ×2 IMPLANT
SUT VLOC 180 0 24IN GS25 (SUTURE) ×3 IMPLANT
SYR 50ML LL SCALE MARK (SYRINGE) ×3 IMPLANT
TAPE STRIPS DRAPE STRL (GAUZE/BANDAGES/DRESSINGS) ×3 IMPLANT
TRAY FOLEY CATH 14FRSI W/METER (CATHETERS) ×3 IMPLANT
WRAP KNEE MAXI GEL POST OP (GAUZE/BANDAGES/DRESSINGS) ×3 IMPLANT
YANKAUER SUCT BULB TIP 10FT TU (MISCELLANEOUS) ×3 IMPLANT

## 2017-04-30 NOTE — Transfer of Care (Signed)
Immediate Anesthesia Transfer of Care Note  Patient: Shawna Ellison  Procedure(s) Performed: LEFT TOTAL KNEE ARTHROPLASTY (Left Knee)  Patient Location: PACU  Anesthesia Type:Spinal  Level of Consciousness: drowsy and patient cooperative  Airway & Oxygen Therapy: Patient Spontanous Breathing and Patient connected to face mask oxygen  Post-op Assessment: Report given to RN and Post -op Vital signs reviewed and stable  Post vital signs: Reviewed and stable  Last Vitals:  Vitals:   04/30/17 1214 04/30/17 1245  BP: 137/76   Pulse: 84 78  Resp: 17 14  Temp:    SpO2: 100% 100%    Last Pain:  Vitals:   04/30/17 0950  TempSrc: Oral         Complications: No apparent anesthesia complications

## 2017-04-30 NOTE — Progress Notes (Signed)
AssistedDr. Ossey with left, ultrasound guided, adductor canal block. Side rails up, monitors on throughout procedure. See vital signs in flow sheet. Tolerated Procedure well.  

## 2017-04-30 NOTE — Discharge Summary (Signed)
Physician Discharge Summary  Patient ID: Shawna Ellison MRN: [id number] DOB/AGE: [birth year] 72 y.o.  Admit date: 04/30/2017 Discharge date: 05/02/17  Admission Diagnoses: Left knee primary OA  Discharge Diagnoses:  Active Problems:   S/P knee replacement   Osteoarthritis of left knee   Discharged Condition: good  Hospital Course:  ZYANN MABRY is a 72 y.o. who was admitted to [facility]. They were brought to the operating room on 04/30/2017 and underwent Procedure(s): LEFT TOTAL KNEE ARTHROPLASTY.  Patient tolerated the procedure well and was later transferred to the recovery room and then to the orthopaedic floor for postoperative care.  They were given PO and IV analgesics for pain control following their surgery.  They were given 24 hours of postoperative antibiotics of  Anti-infectives (From admission, onward)   Start     Dose/Rate Route Frequency Ordered Stop   04/30/17 0933  ceFAZolin (ANCEF) IVPB 2g/100 mL premix  Status:  Discontinued     2 g 200 mL/hr over 30 Minutes Intravenous On call to O.R. 04/30/17 [id number] 04/30/17 0938   04/30/17 0932  ceFAZolin (ANCEF) IVPB 2g/100 mL premix     2 g 200 mL/hr over 30 Minutes Intravenous On call to O.R. 04/30/17 0932 05/01/17 0559     and started on DVT prophylaxis in the form of lovenox.   PT and OT were ordered for total joint protocol.  Discharge planning consulted to help with postop disposition and equipment needs.  Patient had a good night on the evening of surgery and started to get up OOB with therapy on day one.  Hemovac drain was pulled without difficulty.  Continued to work with therapy into day two.  Dressing was with normal limits.  The patient had progressed with therapy and meeting their goals. Patient was seen in rounds and was ready to go home.  Consults: n/a  Significant Diagnostic Studies: routine  Treatments:routine  Discharge Exam: Blood pressure 137/76, pulse 78, temperature 98 F (36.7 C), temperature  source Oral, resp. rate 14, height 5' 3.5" (1.613 m), weight 68.5 kg (151 lb), SpO2 100 %. Alert and oriented x3. RRR, Lungs clear, BS x4. Left Calf soft and non tender. L knee dressing C/D/I. No DVT signs. No signs of infection or compartment syndrome. LLE grossly neurovascularly intact.   Disposition: 06-Home-Health Care Svc  Discharge Instructions    Call MD / Call 911   Complete by:  As directed    If you experience chest pain or shortness of breath, CALL 911 and be transported to the hospital emergency room.  If you develope a fever above 101 F, pus (white drainage) or increased drainage or redness at the wound, or calf pain, call your surgeon's office.   Constipation Prevention   Complete by:  As directed    Drink plenty of fluids.  Prune juice may be helpful.  You may use a stool softener, such as Colace (over the counter) 100 mg twice a day.  Use MiraLax (over the counter) for constipation as needed.   Diet - low sodium heart healthy   Complete by:  As directed    Discharge instructions   Complete by:  As directed    INSTRUCTIONS AFTER JOINT REPLACEMENT   Remove items at home which could result in a fall. This includes throw rugs or furniture in walking pathways ICE to the affected joint every three hours while awake for 30 minutes at a time, for at least the first 3-5 days, and then as  needed for pain and swelling.  Continue to use ice for pain and swelling. You may notice swelling that will progress down to the foot and ankle.  This is normal after surgery.  Elevate your leg when you are not up walking on it.   Continue to use the breathing machine you got in the hospital (incentive spirometer) which will help keep your temperature down.  It is common for your temperature to cycle up and down following surgery, especially at night when you are not up moving around and exerting yourself.  The breathing machine keeps your lungs expanded and your temperature down.   DIET:  As you were  doing prior to hospitalization, we recommend a well-balanced diet.  DRESSING / WOUND CARE / SHOWERING  Keep the surgical dressing until follow up.  The dressing is water proof, so you can shower without any extra covering.  IF THE DRESSING FALLS OFF or the wound gets wet inside, change the dressing with sterile gauze.  Please use good hand washing techniques before changing the dressing.  Do not use any lotions or creams on the incision until instructed by your surgeon.    ACTIVITY  Increase activity slowly as tolerated, but follow the weight bearing instructions below.   No driving for 6 weeks or until further direction given by your physician.  You cannot drive while taking narcotics.  No lifting or carrying greater than 10 lbs. until further directed by your surgeon. Avoid periods of inactivity such as sitting longer than an hour when not asleep. This helps prevent blood clots.  You may return to work once you are authorized by your doctor.     WEIGHT BEARING   Weight bearing as tolerated with assist device (walker, cane, etc) as directed, use it as long as suggested by your surgeon or therapist, typically at least 4-6 weeks.   EXERCISES  Results after joint replacement surgery are often greatly improved when you follow the exercise, range of motion and muscle strengthening exercises prescribed by your doctor. Safety measures are also important to protect the joint from further injury. Any time any of these exercises cause you to have increased pain or swelling, decrease what you are doing until you are comfortable again and then slowly increase them. If you have problems or questions, call your caregiver or physical therapist for advice.   Rehabilitation is important following a joint replacement. After just a few days of immobilization, the muscles of the leg can become weakened and shrink (atrophy).  These exercises are designed to build up the tone and strength of the thigh and leg  muscles and to improve motion. Often times heat used for twenty to thirty minutes before working out will loosen up your tissues and help with improving the range of motion but do not use heat for the first two weeks following surgery (sometimes heat can increase post-operative swelling).   These exercises can be done on a training (exercise) mat, on the floor, on a table or on a bed. Use whatever works the best and is most comfortable for you.    Use music or television while you are exercising so that the exercises are a pleasant break in your day. This will make your life better with the exercises acting as a break in your routine that you can look forward to.   Perform all exercises about fifteen times, three times per day or as directed.  You should exercise both the operative leg and the other leg  as well.   Exercises include:   Quad Sets - Tighten up the muscle on the front of the thigh (Quad) and hold for 5-10 seconds.   Straight Leg Raises - With your knee straight (if you were given a brace, keep it on), lift the leg to 60 degrees, hold for 3 seconds, and slowly lower the leg.  Perform this exercise against resistance later as your leg gets stronger.  Leg Slides: Lying on your back, slowly slide your foot toward your buttocks, bending your knee up off the floor (only go as far as is comfortable). Then slowly slide your foot back down until your leg is flat on the floor again.  Angel Wings: Lying on your back spread your legs to the side as far apart as you can without causing discomfort.  Hamstring Strength:  Lying on your back, push your heel against the floor with your leg straight by tightening up the muscles of your buttocks.  Repeat, but this time bend your knee to a comfortable angle, and push your heel against the floor.  You may put a pillow under the heel to make it more comfortable if necessary.   A rehabilitation program following joint replacement surgery can speed recovery and  prevent re-injury in the future due to weakened muscles. Contact your doctor or a physical therapist for more information on knee rehabilitation.    CONSTIPATION  Constipation is defined medically as fewer than three stools per week and severe constipation as less than one stool per week.  Even if you have a regular bowel pattern at home, your normal regimen is likely to be disrupted due to multiple reasons following surgery.  Combination of anesthesia, postoperative narcotics, change in appetite and fluid intake all can affect your bowels.   YOU MUST use at least one of the following options; they are listed in order of increasing strength to get the job done.  They are all available over the counter, and you may need to use some, POSSIBLY even all of these options:    Drink plenty of fluids (prune juice may be helpful) and high fiber foods Colace 100 mg by mouth twice a day  Senokot for constipation as directed and as needed Dulcolax (bisacodyl), take with full glass of water  Miralax (polyethylene glycol) once or twice a day as needed.  If you have tried all these things and are unable to have a bowel movement in the first 3-4 days after surgery call either your surgeon or your primary doctor.    If you experience loose stools or diarrhea, hold the medications until you stool forms back up.  If your symptoms do not get better within 1 week or if they get worse, check with your doctor.  If you experience "the worst abdominal pain ever" or develop nausea or vomiting, please contact the office immediately for further recommendations for treatment.   ITCHING:  If you experience itching with your medications, try taking only a single pain pill, or even half a pain pill at a time.  You can also use Benadryl over the counter for itching or also to help with sleep.   TED HOSE STOCKINGS:  Use stockings on both legs until for at least 2 weeks or as directed by physician office. They may be removed at  night for sleeping.  MEDICATIONS:  See your medication summary on the "After Visit Summary" that nursing will review with you.  You may have some home medications which will be  placed on hold until you complete the course of blood thinner medication.  It is important for you to complete the blood thinner medication as prescribed.  PRECAUTIONS:  If you experience chest pain or shortness of breath - call 911 immediately for transfer to the hospital emergency department.   If you develop a fever greater that 101 F, purulent drainage from wound, increased redness or drainage from wound, foul odor from the wound/dressing, or calf pain - CONTACT YOUR SURGEON.                                                   FOLLOW-UP APPOINTMENTS:  If you do not already have a post-op appointment, please call the office for an appointment to be seen by your surgeon.  Guidelines for how soon to be seen are listed in your "After Visit Summary", but are typically between 1-4 weeks after surgery.  OTHER INSTRUCTIONS:   Knee Replacement:  Do not place pillow under knee, focus on keeping the knee straight while resting. CPM instructions: 0-90 degrees, 2 hours in the morning, 2 hours in the afternoon, and 2 hours in the evening. Place foam block, curve side up under heel at all times except when in CPM or when walking.  DO NOT modify, tear, cut, or change the foam block in any way.  MAKE SURE YOU:  Understand these instructions.  Get help right away if you are not doing well or get worse.    Thank you for letting us be a part of your medical care team.  It is a privilege we respect greatly.  We hope these instructions will help you stay on track for a fast and full recovery!   Increase activity slowly as tolerated   Complete by:  As directed      Allergies as of 04/30/2017   No Known Allergies     Medication List    STOP taking these medications   naproxen sodium 220 MG tablet Commonly known as:  ALEVE     TAKE  these medications   aspirin EC 325 MG tablet Take 1 tablet (325 mg total) by mouth 2 (two) times daily.   BIOTIN PO Take 2 each by mouth daily. 2 gummys daily   buPROPion 150 MG 24 hr tablet Commonly known as:  WELLBUTRIN XL Take 150 mg by mouth daily.   Cholecalciferol 1000 units Chew Chew 2,000 Units by mouth daily.   cyanocobalamin 1000 MCG/ML injection Commonly known as:  (VITAMIN B-12) Inject 1,000 mcg into the muscle every 30 (thirty) days.   methocarbamol 500 MG tablet Commonly known as:  ROBAXIN Take 1 tablet (500 mg total) by mouth every 8 (eight) hours as needed for muscle spasms. What changed:  when to take this   MULTIVITAMIN GUMMIES WOMENS PO Take 2 tablets by mouth daily. With collagen   OVER THE COUNTER MEDICATION Take 2 tablets by mouth daily. Amberen for Menopausal Symptoms   oxyCODONE 5 MG immediate release tablet Commonly known as:  ROXICODONE Take 1 tablet (5 mg total) by mouth every 4 (four) hours as needed.   valsartan 160 MG tablet Commonly known as:  DIOVAN TAKE ONE TABLET BY MOUTH ONCE DAILY        Signed: Lajean Manes 04/30/2017, 12:56 PM

## 2017-04-30 NOTE — Anesthesia Preprocedure Evaluation (Addendum)
Anesthesia Evaluation  Patient identified by MRN, date of birth, ID band Patient awake    Reviewed: Allergy & Precautions, NPO status , Patient's Chart, lab work & pertinent test results  Airway Mallampati: I  TM Distance: >3 FB Neck ROM: Full    Dental   Pulmonary    Pulmonary exam normal        Cardiovascular hypertension, Pt. on medications Normal cardiovascular exam     Neuro/Psych Depression    GI/Hepatic   Endo/Other    Renal/GU      Musculoskeletal   Abdominal   Peds  Hematology   Anesthesia Other Findings   Reproductive/Obstetrics                             Anesthesia Physical Anesthesia Plan  ASA: II  Anesthesia Plan: Spinal   Post-op Pain Management:  Regional for Post-op pain   Induction: Intravenous  PONV Risk Score and Plan: 2 and Ondansetron and Treatment may vary due to age or medical condition  Airway Management Planned: Simple Face Mask  Additional Equipment:   Intra-op Plan:   Post-operative Plan:   Informed Consent: I have reviewed the patients History and Physical, chart, labs and discussed the procedure including the risks, benefits and alternatives for the proposed anesthesia with the patient or authorized representative who has indicated his/her understanding and acceptance.     Plan Discussed with: CRNA and Surgeon  Anesthesia Plan Comments:         Anesthesia Quick Evaluation

## 2017-04-30 NOTE — Anesthesia Postprocedure Evaluation (Signed)
Anesthesia Post Note  Patient: BANNIE LOBBAN  Procedure(s) Performed: LEFT TOTAL KNEE ARTHROPLASTY (Left Knee)     Patient location during evaluation: PACU Anesthesia Type: Spinal Level of consciousness: oriented and awake and alert Pain management: pain level controlled Vital Signs Assessment: post-procedure vital signs reviewed and stable Respiratory status: spontaneous breathing, respiratory function stable and patient connected to nasal cannula oxygen Cardiovascular status: blood pressure returned to baseline and stable Postop Assessment: no headache, no backache and no apparent nausea or vomiting Anesthetic complications: no    Last Vitals:  Vitals:   04/30/17 1530 04/30/17 1545  BP: 131/76 (!) 143/71  Pulse: 84 77  Resp: (!) 21 16  Temp:    SpO2: 99% 97%    Last Pain:  Vitals:   04/30/17 1550  TempSrc:   PainSc: 1     LLE Motor Response: Purposeful movement;Responds to commands (04/30/17 1545) LLE Sensation: Numbness (04/30/17 1545) RLE Motor Response: Purposeful movement;Responds to commands (04/30/17 1545) RLE Sensation: Numbness (04/30/17 1545) L Sensory Level: L5-Outer lower leg, top of foot, great toe (04/30/17 1545) R Sensory Level: L4-Anterior knee, lower leg (04/30/17 1545)  Lacy Sofia DAVID

## 2017-04-30 NOTE — Interval H&P Note (Signed)
History and Physical Interval Note:  04/30/2017 1:05 PM  Shawna Ellison  has presented today for surgery, with the diagnosis of Left knee osteoarthritis  The various methods of treatment have been discussed with the patient and family. After consideration of risks, benefits and other options for treatment, the patient has consented to  Procedure(s): LEFT TOTAL KNEE ARTHROPLASTY (Left) as a surgical intervention .  The patient's history has been reviewed, patient examined, no change in status, stable for surgery.  I have reviewed the patient's chart and labs.  Questions were answered to the patient's satisfaction.     Shawna Ellison ANDREW

## 2017-04-30 NOTE — Op Note (Signed)
DATE OF SURGERY:  04/30/2017  TIME: 2:51 PM  PATIENT NAME:  Shawna Ellison    AGE: 72 y.o.   PRE-OPERATIVE DIAGNOSIS:  Left knee osteoarthritis  POST-OPERATIVE DIAGNOSIS:  Left knee osteoarthritis  PROCEDURE:  Procedure(s): LEFT TOTAL KNEE ARTHROPLASTY  SURGEON:  Jessilyn Catino ANDREW  ASSISTANT:  Bryson Stilwell, PA-C, present and scrubbed throughout the case, critical for assistance with exposure, retraction, instrumentation, and closure.  OPERATIVE IMPLANTS: Depuy PFC Sigma Rotating Platform.  Femur size 4, Tibia size 4, Patella size 35 3-peg oval button, with a 12.5 mm polyethylene insert.   PREOPERATIVE INDICATIONS:   Shawna Ellison is a 72 y.o. year old female with end stage bone on bone arthritis of the knee who failed conservative treatment and elected for Total Knee Arthroplasty.   The risks, benefits, and alternatives were discussed at length including but not limited to the risks of infection, bleeding, nerve injury, stiffness, blood clots, the need for revision surgery, cardiopulmonary complications, among others, and they were willing to proceed.  OPERATIVE DESCRIPTION:  The patient was brought to the operative room and placed in a supine position.  Spinal anesthesia was administered.  IV antibiotics were given.  The lower extremity was prepped and draped in the usual sterile fashion.  Time out was performed.  The leg was elevated and exsanguinated and the tourniquet was inflated.  Anterior quadriceps tendon splitting approach was performed.  The patella was retracted and osteophytes were removed.  The anterior horn of the medial and lateral meniscus was removed and cruciate ligaments resected.   The distal femur was opened with the drill and the intramedullary distal femoral cutting jig was utilized, set at 5 degrees resecting 10 mm off the distal femur.  Care was taken to protect the collateral ligaments.  The distal femoral sizing jig was applied, taking care to  avoid notching.  Then the 4-in-1 cutting jig was applied and the anterior and posterior femur was cut, along with the chamfer cuts.    Then the extramedullary tibial cutting jig was utilized making the appropriate cut using the anterior tibial crest as a reference building in appropriate posterior slope.  Care was taken during the cut to protect the medial and collateral ligaments.  The proximal tibia was removed along with the posterior horns of the menisci.   The posterior medial femoral osteophytes and posterior lateral femoral osteophytes were removed.    The flexion gap was then measured and was symmetric with the extension gap, measured at 12.  I completed the distal femoral preparation using the appropriate jig to prepare the box.  The patella was then measured, and cut with the saw.    The proximal tibia sized and prepared accordingly with the reamer and the punch, and then all components were trialed with the trial insert.  The knee was found to have excellent balance and full motion.    The above named components were then cemented into place and all excess cement was removed.  The trial polyethylene component was in place during cementation, and then was exchanged for the real polyethylene component.    The knee was easily taken through a range of motion and the patella tracked well and the knee irrigated copiously and the parapatellar and subcutaneous tissue closed with vicryl, and monocryl with steri strips for the skin.  The arthrotomy was closed at 90 of flexion. The wounds were dressed with sterile gauze and the tourniquet released and the patient was awakened and returned to the PACU  in stable and satisfactory condition.  There were no complications.  Total tourniquet time was 80 minutes.

## 2017-04-30 NOTE — Anesthesia Procedure Notes (Signed)
Spinal  Start time: 04/30/2017 1:15 PM End time: 04/30/2017 1:20 PM Staffing Anesthesiologist: Lillia Abed, MD Resident/CRNA: Montel Clock, CRNA Performed: resident/CRNA  Preanesthetic Checklist Completed: patient identified, surgical consent, pre-op evaluation, timeout performed, IV checked, risks and benefits discussed and monitors and equipment checked Spinal Block Patient position: sitting Prep: DuraPrep Patient monitoring: heart rate, cardiac monitor, continuous pulse ox and blood pressure Approach: midline Location: L3-4 Injection technique: single-shot Needle Needle type: Pencan  Needle gauge: 24 G Needle length: 10 cm Needle insertion depth: 7.5 cm Assessment Sensory level: T6

## 2017-05-01 LAB — BASIC METABOLIC PANEL
Anion gap: 7 (ref 5–15)
BUN: 9 mg/dL (ref 6–20)
CALCIUM: 8.3 mg/dL — AB (ref 8.9–10.3)
CO2: 25 mmol/L (ref 22–32)
CREATININE: 0.52 mg/dL (ref 0.44–1.00)
Chloride: 104 mmol/L (ref 101–111)
GFR calc non Af Amer: >60 mL/min (ref >60)
Glucose, Bld: 119 mg/dL — ABNORMAL HIGH (ref 65–99)
Potassium: 4 mmol/L (ref 3.5–5.1)
Sodium: 136 mmol/L (ref 135–145)

## 2017-05-01 LAB — CBC
HEMATOCRIT: 33.3 % — AB (ref 36.0–46.0)
Hemoglobin: 11 g/dL — ABNORMAL LOW (ref 12.0–15.0)
MCH: 31.3 pg (ref 26.0–34.0)
MCHC: 33 g/dL (ref 30.0–36.0)
MCV: 94.6 fL (ref 78.0–100.0)
Platelets: 191 10*3/uL (ref 150–400)
RBC: 3.52 MIL/uL — ABNORMAL LOW (ref 3.87–5.11)
RDW: 12.6 % (ref 11.5–15.5)
WBC: 8.2 10*3/uL (ref 4.0–10.5)

## 2017-05-01 NOTE — Evaluation (Signed)
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 Will follow in acute setting to further educate on bathroom transfers.  Her husband will assist her as needed at home.  Pt did have a LOB during hygiene this session    Follow Up Recommendations  Supervision/Assistance - 24 hour    Equipment Recommendations  None recommended by OT    Recommendations for Other Services       Precautions / Restrictions Precautions Precautions: Knee;Fall Required Braces or Orthoses: Knee Immobilizer - Left Knee Immobilizer - Left: Discontinue once straight leg raise with < 10 degree lag Restrictions Other Position/Activity Restrictions: WBAT      Mobility Bed Mobility         Supine to sit: Min assist     General bed mobility comments: assist for LLE  Transfers   Equipment used: Rolling walker (2 wheeled)   Sit to Stand: Min assist         General transfer comment: light assistance to rise and steady.  Cues for UE/LE placement    Balance                                           ADL either performed or assessed with clinical judgement   ADL Overall ADL's : Needs assistance/impaired             Lower Body Bathing: Minimal assistance;Sit to/from stand       Lower Body Dressing: Maximal assistance;Sit to/from stand   Toilet Transfer: Minimal assistance;Ambulation;Comfort height toilet;RW   Toileting- Clothing Manipulation and Hygiene: Minimal assistance;Sit to/from stand         General ADL Comments: ambulated to bathroom, sat on higher commode and performed ADL.  Pt did have a LOB during hygiene in standing.  Cued to keep one hand on the walker for safety. Reviewed precautions     Vision         Perception     Praxis       Pertinent Vitals/Pain Pain Score: 5  Pain Location: L knee Pain Descriptors / Indicators: Aching;Sore Pain Intervention(s): Limited activity within patient's tolerance;Monitored during session;Premedicated before session;Repositioned;Ice applied     Hand Dominance     Extremity/Trunk Assessment Upper Extremity Assessment Upper Extremity Assessment: Overall WFL for tasks assessed           Communication Communication Communication: No difficulties   Cognition Arousal/Alertness: Awake/alert Behavior During Therapy: WFL for tasks assessed/performed Overall Cognitive Status: Within Functional Limits for tasks assessed                                     General Comments       Exercises     Shoulder Instructions      Home Living Family/patient expects to be discharged to:: Private residence Living Arrangements: Spouse/significant other Available Help at Discharge: Family               Bathroom Shower/Tub: Walk-in shower   Bathroom Toilet: Handicapped height     Home Equipment: Environmental consultant - 2 wheels;Cane - single point;Crutches  Prior Functioning/Environment Level of Independence: Independent                 OT Problem List: Decreased strength;Decreased activity tolerance;Impaired balance (sitting and/or standing);Pain;Decreased knowledge of use of DME or AE;Decreased knowledge of precautions      OT Treatment/Interventions: Self-care/ADL training;DME and/or AE instruction;Patient/family education;Balance training    OT Goals(Current goals can be found in the care plan section) Acute Rehab OT Goals Patient Stated Goal: Regain IND OT Goal Formulation: With patient Time For Goal Achievement: 05/15/17 Potential to Achieve Goals: Good ADL Goals Pt Will Transfer to Toilet: with supervision;ambulating(high commode) Pt Will Perform Toileting - Clothing Manipulation and hygiene: with supervision;sit to/from stand Pt Will Perform  Tub/Shower Transfer: Shower transfer;with min guard assist;ambulating(? seat)  OT Frequency: Min 2X/week   Barriers to D/C:            Co-evaluation              AM-PAC PT "6 Clicks" Daily Activity     Outcome Measure Help from another person eating meals?: None Help from another person taking care of personal grooming?: A Little Help from another person toileting, which includes using toliet, bedpan, or urinal?: A Little Help from another person bathing (including washing, rinsing, drying)?: A Little Help from another person to put on and taking off regular upper body clothing?: A Little Help from another person to put on and taking off regular lower body clothing?: A Lot 6 Click Score: 18   End of Session    Activity Tolerance: Patient tolerated treatment well Patient left: in bed;with call bell/phone within reach;with family/visitor present  OT Visit Diagnosis: Pain Pain - Right/Left: Left Pain - part of body: Knee                Time: 2330-0762 OT Time Calculation (min): 28 min Charges:  OT General Charges $OT Visit: 1 Visit OT Evaluation $OT Eval Low Complexity: 1 Low OT Treatments $Self Care/Home Management : 8-22 mins G-Codes:     Pena, OTR/L [phone] 05/01/2017  Shawna Ellison 05/01/2017, 2:32 PM

## 2017-05-01 NOTE — Progress Notes (Signed)
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  Pt hopeful for dc home tomorrow.   Follow Up Recommendations  DC plan and follow up therapy as arranged by surgeon     Equipment Recommendations  None recommended by PT    Recommendations for Other Services OT consult     Precautions / Restrictions Precautions Precautions: Knee;Fall Required Braces or Orthoses: Knee Immobilizer - Left Knee Immobilizer - Left: Discontinue once straight leg raise with < 10 degree lag Restrictions Weight Bearing Restrictions: No Other Position/Activity Restrictions: WBAT    Mobility  Bed Mobility Overal bed mobility: Needs Assistance Bed Mobility: Supine to Sit;Sit to Supine     Supine to sit: Supervision Sit to supine: Min guard   General bed mobility comments: assist for LLE  Transfers Overall transfer level: Needs assistance Equipment used: Rolling walker (2 wheeled) Transfers: Sit to/from Stand Sit to Stand: Min guard         General transfer comment: cues for use of UEs  Ambulation/Gait Ambulation/Gait assistance: Min guard Ambulation Distance (Feet): 122 Feet Assistive device: Rolling walker (2 wheeled) Gait Pattern/deviations: Step-to pattern;Decreased step length - right;Decreased step length - left;Shuffle;Trunk flexed Gait velocity: decr Gait velocity interpretation: Below normal speed for age/gender General Gait Details: min cues for sequence, posture and position from RW.  Distance ltd by nausea   Stairs            Wheelchair Mobility    Modified Rankin (Stroke Patients Only)       Balance                                            Cognition Arousal/Alertness: Awake/alert Behavior During Therapy: WFL  for tasks assessed/performed Overall Cognitive Status: Within Functional Limits for tasks assessed                                        Exercises Total Joint Exercises Ankle Circles/Pumps: AROM;Both;15 reps;Supine Quad Sets: AROM;Both;10 reps;Supine Heel Slides: AAROM;Left;15 reps;Supine Straight Leg Raises: AAROM;AROM;Left;15 reps;Supine    General Comments        Pertinent Vitals/Pain Pain Score: 5  Pain Location: L knee Pain Descriptors / Indicators: Aching;Sore Pain Intervention(s): Limited activity within patient's tolerance;Monitored during session;Premedicated before session;Ice applied    Home Living Family/patient expects to be discharged to:: Private residence Living Arrangements: Spouse/significant other Available Help at Discharge: Family         Home Equipment: Gilford Rile - 2 wheels;Cane - single point;Crutches      Prior Function Level of Independence: Independent          PT Goals (current goals can now be found in the care plan section) Acute Rehab PT Goals Patient Stated Goal: Regain IND PT Goal Formulation: With patient Time For Goal Achievement: 05/08/17 Potential to Achieve Goals: Good Progress towards PT goals: Progressing toward goals    Frequency    7X/week      PT Plan Current plan remains appropriate    Co-evaluation  AM-PAC PT "6 Clicks" Daily Activity  Outcome Measure  Difficulty turning over in bed (including adjusting bedclothes, sheets and blankets)?: A Lot Difficulty moving from lying on back to sitting on the side of the bed? : A Lot Difficulty sitting down on and standing up from a chair with arms (e.g., wheelchair, bedside commode, etc,.)?: A Lot Help needed moving to and from a bed to chair (including a wheelchair)?: A Little Help needed walking in hospital room?: A Little Help needed climbing 3-5 steps with a railing? : A Little 6 Click Score: 15    End of Session Equipment  Utilized During Treatment: Gait belt;Left knee immobilizer Activity Tolerance: Patient tolerated treatment well Patient left: in bed;with call bell/phone within reach Nurse Communication: Mobility status PT Visit Diagnosis: Difficulty in walking, not elsewhere classified (R26.2)     Time: 1455-1520 PT Time Calculation (min) (ACUTE ONLY): 25 min  Charges:  $Gait Training: 8-22 mins $Therapeutic Exercise: 8-22 mins                    G Codes:       Pg [phone]    Charon Akamine 05/01/2017, 3:18 PM

## 2017-05-01 NOTE — Evaluation (Signed)
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  Pt should progress to dc home with family assist.  Pt states her first OP PT appt is Monday 05/04/17.    Follow Up Recommendations DC plan and follow up therapy as arranged by surgeon    Equipment Recommendations  None recommended by PT    Recommendations for Other Services OT consult     Precautions / Restrictions Precautions Precautions: Knee;Fall Required Braces or Orthoses: Knee Immobilizer - Left Knee Immobilizer - Left: Discontinue once straight leg raise with < 10 degree lag Restrictions Weight Bearing Restrictions: No Other Position/Activity Restrictions: WBAT      Mobility  Bed Mobility Overal bed mobility: Needs Assistance Bed Mobility: Supine to Sit     Supine to sit: Min assist     General bed mobility comments: cues for sequence and use of R LE to self assist  Transfers Overall transfer level: Needs assistance Equipment used: Rolling walker (2 wheeled) Transfers: Sit to/from Stand Sit to Stand: Min assist         General transfer comment: cues for LE management and use of UEs to self assist  Ambulation/Gait Ambulation/Gait assistance: Min assist Ambulation Distance (Feet): 38 Feet Assistive device: Rolling walker (2 wheeled) Gait Pattern/deviations: Step-to pattern;Decreased step length - right;Decreased step length - left;Shuffle;Trunk flexed Gait velocity: decr Gait velocity interpretation: Below normal speed for age/gender General Gait Details: cues for sequence, posture and position from RW.  Distance ltd by nausea  Stairs            Wheelchair Mobility    Modified Rankin (Stroke Patients Only)       Balance                                              Pertinent Vitals/Pain Pain Assessment: 0-10 Pain Score: 5  Pain Location: L knee Pain Descriptors / Indicators: Aching;Sore Pain Intervention(s): Limited activity within patient's tolerance;Monitored during session;Premedicated before session;Ice applied    Home Living Family/patient expects to be discharged to:: Private residence Living Arrangements: Spouse/significant other Available Help at Discharge: Family Type of Home: House Home Access: Stairs to enter Entrance Stairs-Rails: Psychiatric nurse of Steps: 2 Home Layout: One level Home Equipment: Environmental consultant - 2 wheels;Cane - single point;Crutches      Prior Function Level of Independence: Independent               Hand Dominance        Extremity/Trunk Assessment   Upper Extremity Assessment Upper Extremity Assessment: Overall WFL for tasks assessed    Lower Extremity Assessment Lower Extremity Assessment: LLE deficits/detail LLE Deficits / Details: 3-/5 quads with AAROM at L knee -10 - 60    Cervical / Trunk Assessment Cervical / Trunk Assessment: Normal  Communication   Communication: No difficulties  Cognition Arousal/Alertness: Awake/alert Behavior During Therapy: WFL for tasks assessed/performed Overall Cognitive Status: Within Functional Limits for tasks assessed  General Comments      Exercises Total Joint Exercises Ankle Circles/Pumps: AROM;Both;15 reps;Supine Quad Sets: AROM;Both;10 reps;Supine Heel Slides: AAROM;Left;15 reps;Supine Hip ABduction/ADduction: AAROM;Left;15 reps;Supine   Assessment/Plan    PT Assessment Patient needs continued PT services  PT Problem List Decreased strength;Decreased range of motion;Decreased activity tolerance;Decreased mobility;Decreased knowledge of use of DME;Pain       PT Treatment Interventions DME instruction;Gait  training;Stair training;Functional mobility training;Therapeutic activities;Therapeutic exercise;Patient/family education    PT Goals (Current goals can be found in the Care Plan section)  Acute Rehab PT Goals Patient Stated Goal: Regain IND PT Goal Formulation: With patient Time For Goal Achievement: 05/08/17 Potential to Achieve Goals: Good    Frequency 7X/week   Barriers to discharge        Co-evaluation               AM-PAC PT "6 Clicks" Daily Activity  Outcome Measure Difficulty turning over in bed (including adjusting bedclothes, sheets and blankets)?: Unable Difficulty moving from lying on back to sitting on the side of the bed? : Unable Difficulty sitting down on and standing up from a chair with arms (e.g., wheelchair, bedside commode, etc,.)?: Unable Help needed moving to and from a bed to chair (including a wheelchair)?: A Little Help needed walking in hospital room?: A Little Help needed climbing 3-5 steps with a railing? : A Lot 6 Click Score: 11    End of Session Equipment Utilized During Treatment: Gait belt;Left knee immobilizer Activity Tolerance: Patient tolerated treatment well Patient left: in chair;with call bell/phone within reach;with family/visitor present Nurse Communication: Mobility status PT Visit Diagnosis: Difficulty in walking, not elsewhere classified (R26.2)    Time: 3546-5681 PT Time Calculation (min) (ACUTE ONLY): 35 min   Charges:   PT Evaluation $PT Eval Low Complexity: 1 Low PT Treatments $Therapeutic Exercise: 8-22 mins   PT G Codes:        Pg [phone]   Mary-Anne Polizzi 05/01/2017, 10:57 AM

## 2017-05-01 NOTE — Progress Notes (Signed)
Subjective: 1 Day Post-Op Procedure(s) (LRB): LEFT TOTAL KNEE ARTHROPLASTY (Left) Patient reports pain as mild to left knee.  Tolerating Po's well. Denies SOB,CP, or calf pain.  Objective: Vital signs in last 24 hours: Temp:  [97.3 F (36.3 C)-98.8 F (37.1 C)] 98.8 F (37.1 C) (02/08 0537) Pulse Rate:  [64-87] 70 (02/08 0537) Resp:  [10-21] 16 (02/08 0537) BP: (122-159)/(61-83) 122/61 (02/08 0537) SpO2:  [97 %-100 %] 98 % (02/08 0537) Weight:  [68.5 kg (151 lb)] 68.5 kg (151 lb) (02/07 1015)  Intake/Output from previous day: 02/07 0701 - 02/08 0700 In: 3473.8 [P.O.:780; I.V.:2438.8; IV Piggyback:255] Out: 2263 [Urine:1050; Drains:215; Blood:50] Intake/Output this shift: Total I/O In: 120 [P.O.:120] Out: -   Recent Labs    04/30/17 1000 05/01/17 0610  HGB 12.4 11.0*   Recent Labs    04/30/17 1000 05/01/17 0610  WBC 4.3 8.2  RBC 4.02 3.52*  HCT 38.5 33.3*  PLT 243 191   Recent Labs    04/30/17 1000 05/01/17 0610  NA 142 136  K 3.3* 4.0  CL 107 104  CO2 29 25  BUN 12 9  CREATININE 0.54 0.52  GLUCOSE 94 119*  CALCIUM 8.9 8.3*   Recent Labs    04/30/17 1000  INR 1.01    Alert and oriented x3. RRR, Lungs clear, BS x4. Left Calf soft and non tender. L knee dressing C/D/I. No DVT signs. No signs of infection or compartment syndrome. LLE grossly neurovascularly intact.   Assessment/Plan: 1 Day Post-Op Procedure(s) (LRB): LEFT TOTAL KNEE ARTHROPLASTY (Left) Up with PT Plan D/c home tomorrow OPPT to start Monday already setup Pain management ASA Bid at D/c  Tressia Labrum L 05/01/2017, 7:55 AM

## 2017-05-02 LAB — CBC
HEMATOCRIT: 34.7 % — AB (ref 36.0–46.0)
HEMOGLOBIN: 11.5 g/dL — AB (ref 12.0–15.0)
MCH: 31.5 pg (ref 26.0–34.0)
MCHC: 33.1 g/dL (ref 30.0–36.0)
MCV: 95.1 fL (ref 78.0–100.0)
Platelets: 197 10*3/uL (ref 150–400)
RBC: 3.65 MIL/uL — AB (ref 3.87–5.11)
RDW: 12.7 % (ref 11.5–15.5)
WBC: 9.1 10*3/uL (ref 4.0–10.5)

## 2017-05-02 NOTE — Progress Notes (Signed)
Patient discharged to home w/ spouse. Given all belongings, instructions, prescriptions, equipment. Husb present for all teaching. Both verbalized understanding of instructions. Escorted to pov via w/c.

## 2017-05-02 NOTE — Discharge Summary (Signed)
Physician Discharge Summary  Patient ID: Shawna Ellison MRN: [id number] DOB/AGE: 70-29-1947 70 y.o.  Admit date: 04/30/2017 Discharge date:  05/02/17  Procedures:  Procedure(s) (LRB): LEFT TOTAL KNEE ARTHROPLASTY (Left)  Attending Physician:  Dr. Theda Sers  Admission Diagnoses:   Left knee end stage osteoarthritis  Discharge Diagnoses:  Active Problems:   S/P knee replacement   Osteoarthritis of left knee  Past Medical History:  Diagnosis Date  . Arthritis   . Complication of anesthesia    slow to awaken after some surgeries  . Depression   . Hypertension    not on meds since 01/2017 due to gastric bypass surgery   . Migraine    hx of    PCP: Prince Solian, MD   Discharged Condition: good  Hospital Course:  Patient underwent the above stated procedure on 04/30/2017. Patient tolerated the procedure well and brought to the recovery room in good condition and subsequently to the floor.   Disposition: 06-Home-Health Care Svc with follow up in 2 weeks     Discharge Instructions    Call MD / Call 911   Complete by:  As directed    If you experience chest pain or shortness of breath, CALL 911 and be transported to the hospital emergency room.  If you develope a fever above 101 F, pus (white drainage) or increased drainage or redness at the wound, or calf pain, call your surgeon's office.   Call MD / Call 911   Complete by:  As directed    If you experience chest pain or shortness of breath, CALL 911 and be transported to the hospital emergency room.  If you develope a fever above 101 F, pus (white drainage) or increased drainage or redness at the wound, or calf pain, call your surgeon's office.   Constipation Prevention   Complete by:  As directed    Drink plenty of fluids.  Prune juice may be helpful.  You may use a stool softener, such as Colace (over the counter) 100 mg twice a day.  Use MiraLax (over the counter) for constipation as needed.   Constipation Prevention    Complete by:  As directed    Drink plenty of fluids.  Prune juice may be helpful.  You may use a stool softener, such as Colace (over the counter) 100 mg twice a day.  Use MiraLax (over the counter) for constipation as needed.   Diet - low sodium heart healthy   Complete by:  As directed    Diet - low sodium heart healthy   Complete by:  As directed    Discharge instructions   Complete by:  As directed    INSTRUCTIONS AFTER JOINT REPLACEMENT   Remove items at home which could result in a fall. This includes throw rugs or furniture in walking pathways ICE to the affected joint every three hours while awake for 30 minutes at a time, for at least the first 3-5 days, and then as needed for pain and swelling.  Continue to use ice for pain and swelling. You may notice swelling that will progress down to the foot and ankle.  This is normal after surgery.  Elevate your leg when you are not up walking on it.   Continue to use the breathing machine you got in the hospital (incentive spirometer) which will help keep your temperature down.  It is common for your temperature to cycle up and down following surgery, especially at night when you are not up moving  around and exerting yourself.  The breathing machine keeps your lungs expanded and your temperature down.   DIET:  As you were doing prior to hospitalization, we recommend a well-balanced diet.  DRESSING / WOUND CARE / SHOWERING  Keep the surgical dressing until follow up.  The dressing is water proof, so you can shower without any extra covering.  IF THE DRESSING FALLS OFF or the wound gets wet inside, change the dressing with sterile gauze.  Please use good hand washing techniques before changing the dressing.  Do not use any lotions or creams on the incision until instructed by your surgeon.    ACTIVITY  Increase activity slowly as tolerated, but follow the weight bearing instructions below.   No driving for 6 weeks or until further direction  given by your physician.  You cannot drive while taking narcotics.  No lifting or carrying greater than 10 lbs. until further directed by your surgeon. Avoid periods of inactivity such as sitting longer than an hour when not asleep. This helps prevent blood clots.  You may return to work once you are authorized by your doctor.     WEIGHT BEARING   Weight bearing as tolerated with assist device (walker, cane, etc) as directed, use it as long as suggested by your surgeon or therapist, typically at least 4-6 weeks.   EXERCISES  Results after joint replacement surgery are often greatly improved when you follow the exercise, range of motion and muscle strengthening exercises prescribed by your doctor. Safety measures are also important to protect the joint from further injury. Any time any of these exercises cause you to have increased pain or swelling, decrease what you are doing until you are comfortable again and then slowly increase them. If you have problems or questions, call your caregiver or physical therapist for advice.   Rehabilitation is important following a joint replacement. After just a few days of immobilization, the muscles of the leg can become weakened and shrink (atrophy).  These exercises are designed to build up the tone and strength of the thigh and leg muscles and to improve motion. Often times heat used for twenty to thirty minutes before working out will loosen up your tissues and help with improving the range of motion but do not use heat for the first two weeks following surgery (sometimes heat can increase post-operative swelling).   These exercises can be done on a training (exercise) mat, on the floor, on a table or on a bed. Use whatever works the best and is most comfortable for you.    Use music or television while you are exercising so that the exercises are a pleasant break in your day. This will make your life better with the exercises acting as a break in your  routine that you can look forward to.   Perform all exercises about fifteen times, three times per day or as directed.  You should exercise both the operative leg and the other leg as well.   Exercises include:   Quad Sets - Tighten up the muscle on the front of the thigh (Quad) and hold for 5-10 seconds.   Straight Leg Raises - With your knee straight (if you were given a brace, keep it on), lift the leg to 60 degrees, hold for 3 seconds, and slowly lower the leg.  Perform this exercise against resistance later as your leg gets stronger.  Leg Slides: Lying on your back, slowly slide your foot toward your buttocks, bending your knee  up off the floor (only go as far as is comfortable). Then slowly slide your foot back down until your leg is flat on the floor again.  Angel Wings: Lying on your back spread your legs to the side as far apart as you can without causing discomfort.  Hamstring Strength:  Lying on your back, push your heel against the floor with your leg straight by tightening up the muscles of your buttocks.  Repeat, but this time bend your knee to a comfortable angle, and push your heel against the floor.  You may put a pillow under the heel to make it more comfortable if necessary.   A rehabilitation program following joint replacement surgery can speed recovery and prevent re-injury in the future due to weakened muscles. Contact your doctor or a physical therapist for more information on knee rehabilitation.    CONSTIPATION  Constipation is defined medically as fewer than three stools per week and severe constipation as less than one stool per week.  Even if you have a regular bowel pattern at home, your normal regimen is likely to be disrupted due to multiple reasons following surgery.  Combination of anesthesia, postoperative narcotics, change in appetite and fluid intake all can affect your bowels.   YOU MUST use at least one of the following options; they are listed in order of  increasing strength to get the job done.  They are all available over the counter, and you may need to use some, POSSIBLY even all of these options:    Drink plenty of fluids (prune juice may be helpful) and high fiber foods Colace 100 mg by mouth twice a day  Senokot for constipation as directed and as needed Dulcolax (bisacodyl), take with full glass of water  Miralax (polyethylene glycol) once or twice a day as needed.  If you have tried all these things and are unable to have a bowel movement in the first 3-4 days after surgery call either your surgeon or your primary doctor.    If you experience loose stools or diarrhea, hold the medications until you stool forms back up.  If your symptoms do not get better within 1 week or if they get worse, check with your doctor.  If you experience "the worst abdominal pain ever" or develop nausea or vomiting, please contact the office immediately for further recommendations for treatment.   ITCHING:  If you experience itching with your medications, try taking only a single pain pill, or even half a pain pill at a time.  You can also use Benadryl over the counter for itching or also to help with sleep.   TED HOSE STOCKINGS:  Use stockings on both legs until for at least 2 weeks or as directed by physician office. They may be removed at night for sleeping.  MEDICATIONS:  See your medication summary on the "After Visit Summary" that nursing will review with you.  You may have some home medications which will be placed on hold until you complete the course of blood thinner medication.  It is important for you to complete the blood thinner medication as prescribed.  PRECAUTIONS:  If you experience chest pain or shortness of breath - call 911 immediately for transfer to the hospital emergency department.   If you develop a fever greater that 101 F, purulent drainage from wound, increased redness or drainage from wound, foul odor from the wound/dressing, or  calf pain - CONTACT YOUR SURGEON.  FOLLOW-UP APPOINTMENTS:  If you do not already have a post-op appointment, please call the office for an appointment to be seen by your surgeon.  Guidelines for how soon to be seen are listed in your "After Visit Summary", but are typically between 1-4 weeks after surgery.  OTHER INSTRUCTIONS:   Knee Replacement:  Do not place pillow under knee, focus on keeping the knee straight while resting. CPM instructions: 0-90 degrees, 2 hours in the morning, 2 hours in the afternoon, and 2 hours in the evening. Place foam block, curve side up under heel at all times except when in CPM or when walking.  DO NOT modify, tear, cut, or change the foam block in any way.  MAKE SURE YOU:  Understand these instructions.  Get help right away if you are not doing well or get worse.    Thank you for letting us be a part of your medical care team.  It is a privilege we respect greatly.  We hope these instructions will help you stay on track for a fast and full recovery!   Increase activity slowly as tolerated   Complete by:  As directed    Increase activity slowly as tolerated   Complete by:  As directed       Allergies as of 05/02/2017   No Known Allergies     Medication List    STOP taking these medications   naproxen sodium 220 MG tablet Commonly known as:  ALEVE     TAKE these medications   aspirin EC 325 MG tablet Take 1 tablet (325 mg total) by mouth 2 (two) times daily.   BIOTIN PO Take 2 each by mouth daily. 2 gummys daily   buPROPion 150 MG 24 hr tablet Commonly known as:  WELLBUTRIN XL Take 150 mg by mouth daily.   Cholecalciferol 1000 units Chew Chew 2,000 Units by mouth daily.   cyanocobalamin 1000 MCG/ML injection Commonly known as:  (VITAMIN B-12) Inject 1,000 mcg into the muscle every 30 (thirty) days.   methocarbamol 500 MG tablet Commonly known as:  ROBAXIN Take 1 tablet (500 mg total) by  mouth every 8 (eight) hours as needed for muscle spasms. What changed:  when to take this   MULTIVITAMIN GUMMIES WOMENS PO Take 2 tablets by mouth daily. With collagen   OVER THE COUNTER MEDICATION Take 2 tablets by mouth daily. Amberen for Menopausal Symptoms   oxyCODONE 5 MG immediate release tablet Commonly known as:  ROXICODONE Take 1 tablet (5 mg total) by mouth every 4 (four) hours as needed.   valsartan 160 MG tablet Commonly known as:  DIOVAN TAKE ONE TABLET BY MOUTH ONCE DAILY        Brad Luna Glasgow, MPAS Physician Assistant [facility] [facility] now [facility]  [place] [address], [address], [place], [place] [address] Phone: [phone] [email or web address] Facebook  [employer]

## 2017-05-02 NOTE — Care Management Note (Signed)
Case Management Note  Patient Details  Name: Shawna Ellison MRN: [id number] Date of Birth: 05/07/1945  Subjective/Objective:   Left TKA                 Action/Plan: Spoke to pt and she has RW at home. Requested 3n1 bedside commode. Contacted [facility] for 3n1 to be delivered to room prior to dc. Offered choice for [facility]. Pt agreeable to [facility].   Expected Discharge Date:  05/02/17               Expected Discharge Plan:  [place]  In-House Referral:  NA  Discharge planning Services  CM Consult  Post Acute Care Choice:  Home Health Choice offered to:  Patient  DME Arranged:  3-N-1 DME Agency:  [place]:  PT [place]:  [facility] (now [facility])  Status of Service:  Completed, signed off  If discussed at [place] of Stay Meetings, dates discussed:    Additional Comments:  Erenest Rasher, RN 05/02/2017, 10:21 AM

## 2017-05-02 NOTE — Progress Notes (Signed)
   Subjective: 2 Days Post-Op Procedure(s) (LRB): LEFT TOTAL KNEE ARTHROPLASTY (Left)  Pt doing well Mild to moderate soreness in the left knee today Denies any new symptoms or issues Patient reports pain as mild.  Objective:   VITALS:   Vitals:   05/01/17 2141 05/02/17 0553  BP: (!) 156/84 (!) 151/84  Pulse: 95 95  Resp: 16 18  Temp: 98.9 F (37.2 C) 99.9 F (37.7 C)  SpO2: 95% 96%    Left knee incision healing well nv intact distally No rashes or edema  Ace removed  LABS Recent Labs    04/30/17 1000 05/01/17 0610 05/02/17 0600  HGB 12.4 11.0* 11.5*  HCT 38.5 33.3* 34.7*  WBC 4.3 8.2 9.1  PLT 243 191 197    Recent Labs    04/30/17 1000 05/01/17 0610  NA 142 136  K 3.3* 4.0  BUN 12 9  CREATININE 0.54 0.52  GLUCOSE 94 119*     Assessment/Plan: 2 Days Post-Op Procedure(s) (LRB): LEFT TOTAL KNEE ARTHROPLASTY (Left) D/c home today  F/u in 2 weeks  Continued PT/OT    Merla Riches PA-C, MPAS [place] [facility] now [employer]  [place] [address], [place], [place], [place] [address] Phone: [phone] [email or web address] Facebook  [employer]

## 2017-05-02 NOTE — Progress Notes (Signed)
Physical Therapy Treatment Patient Details Name: Shawna Ellison MRN: [id number] DOB: 05-02-1945 Today's Date: 05/02/2017    History of Present Illness Pt s/p L TKR  and with hx of R TKR in 2016    PT Comments    Pt progressing steadily despite c/o increased pain/stiffness.  Reviewed stairs, don/doff KI and home therex with written instruction provided.     Follow Up Recommendations  DC plan and follow up therapy as arranged by surgeon     Equipment Recommendations  None recommended by PT    Recommendations for Other Services OT consult     Precautions / Restrictions Precautions Precautions: Knee;Fall Required Braces or Orthoses: Knee Immobilizer - Left Knee Immobilizer - Left: Discontinue once straight leg raise with < 10 degree lag Restrictions Weight Bearing Restrictions: No Other Position/Activity Restrictions: WBAT    Mobility  Bed Mobility Overal bed mobility: Needs Assistance Bed Mobility: Supine to Sit;Sit to Supine     Supine to sit: Supervision Sit to supine: Supervision      Transfers Overall transfer level: Needs assistance Equipment used: Rolling walker (2 wheeled) Transfers: Sit to/from Stand Sit to Stand: Min guard;Supervision         General transfer comment: for safety; no LOB. Cues for UE/LE placement  Ambulation/Gait Ambulation/Gait assistance: Min guard;Supervision Ambulation Distance (Feet): 150 Feet Assistive device: Rolling walker (2 wheeled) Gait Pattern/deviations: Step-to pattern;Decreased step length - right;Decreased step length - left;Shuffle;Trunk flexed Gait velocity: decr Gait velocity interpretation: Below normal speed for age/gender General Gait Details: min cues for sequence, posture and position from RW.  Distance ltd by nausea   Stairs            Wheelchair Mobility    Modified Rankin (Stroke Patients Only)       Balance Overall balance assessment: No apparent balance deficits (not formally assessed)                                           Cognition Arousal/Alertness: Awake/alert Behavior During Therapy: WFL for tasks assessed/performed Overall Cognitive Status: Within Functional Limits for tasks assessed                                        Exercises Total Joint Exercises Ankle Circles/Pumps: AROM;Both;15 reps;Supine Quad Sets: AROM;Both;10 reps;Supine Heel Slides: AAROM;Left;Supine;20 reps Straight Leg Raises: AAROM;AROM;Left;Supine;20 reps Goniometric ROM: AAROM L knee -10 - 40    General Comments        Pertinent Vitals/Pain Pain Assessment: 0-10 Pain Score: 5  Pain Location: L knee Pain Descriptors / Indicators: Aching;Sore Pain Intervention(s): Limited activity within patient's tolerance;Monitored during session;Premedicated before session;Ice applied    Home Living                      Prior Function            PT Goals (current goals can now be found in the care plan section) Acute Rehab PT Goals PT Goal Formulation: With patient Time For Goal Achievement: 05/08/17 Potential to Achieve Goals: Good Progress towards PT goals: Progressing toward goals    Frequency    7X/week      PT Plan Current plan remains appropriate    Co-evaluation  AM-PAC PT "6 Clicks" Daily Activity  Outcome Measure  Difficulty turning over in bed (including adjusting bedclothes, sheets and blankets)?: A Lot Difficulty moving from lying on back to sitting on the side of the bed? : A Lot Difficulty sitting down on and standing up from a chair with arms (e.g., wheelchair, bedside commode, etc,.)?: A Lot Help needed moving to and from a bed to chair (including a wheelchair)?: A Little Help needed walking in hospital room?: A Little Help needed climbing 3-5 steps with a railing? : A Little 6 Click Score: 15    End of Session Equipment Utilized During Treatment: Gait belt;Left knee immobilizer Activity  Tolerance: Patient tolerated treatment well Patient left: in bed;with call bell/phone within reach Nurse Communication: Mobility status PT Visit Diagnosis: Difficulty in walking, not elsewhere classified (R26.2)     Time: 1010-1048 PT Time Calculation (min) (ACUTE ONLY): 38 min  Charges:  $Gait Training: 8-22 mins $Therapeutic Exercise: 8-22 mins $Therapeutic Activity: 8-22 mins                    G Codes:       Pg [phone]    Shawna Ellison 05/02/2017, 1:04 PM

## 2017-05-02 NOTE — Progress Notes (Signed)
Occupational Therapy Treatment Patient Details Name: Shawna Ellison MRN: [id number] DOB: [birth year] Today's Date: 05/02/2017    History of present illness Pt s/p L TKR  and with hx of R TKR in 2016   OT comments  All education was completed this session. No LOB  Follow Up Recommendations  Supervision/Assistance - 24 hour    Equipment Recommendations  None recommended by OT(pt does not want 3:1)    Recommendations for Other Services      Precautions / Restrictions Precautions Precautions: Knee;Fall Required Braces or Orthoses: Knee Immobilizer - Left Knee Immobilizer - Left: Discontinue once straight leg raise with < 10 degree lag Restrictions Weight Bearing Restrictions: No Other Position/Activity Restrictions: WBAT       Mobility Bed Mobility         Supine to sit: Supervision Sit to supine: Supervision   General bed mobility comments: HOB raised  Transfers   Equipment used: Rolling walker (2 wheeled)   Sit to Stand: Min guard         General transfer comment: for safety; no LOB. Cues for UE/LE placement    Balance                                           ADL either performed or assessed with clinical judgement   ADL                           Toilet Transfer: Min guard;Ambulation;Comfort height toilet;Grab bars;RW   Toileting- Water quality scientist and Hygiene: Min guard;Sit to/from stand   Tub/ Shower Transfer: Walk-in shower;Min guard;Ambulation;Rolling walker     General ADL Comments: pt ambulated to bathroom and used commode. She did use grab bar and she only has a wall next to her. Yesterday, she did not use grab bar.  Cued her to place hand on commode, but she kept it on the bar. She believes she will be fine with her set up and husband can assist as needed. She doesn't want a 3:1 commode.  Pt does not have a shower seat nor grab bar. She doesn't think she has a seat that will fit in the small shower.  Educated to  wait until she doesn't need KI, to have husband within arms reach, and to keep both feet on floor, sitting on commode to wash legs after showering     Vision       Perception     Praxis      Cognition Arousal/Alertness: Awake/alert Behavior During Therapy: WFL for tasks assessed/performed Overall Cognitive Status: Within Functional Limits for tasks assessed                                          Exercises     Shoulder Instructions       General Comments      Pertinent Vitals/ Pain       Pain Score: 5  Pain Location: L knee Pain Descriptors / Indicators: Aching;Sore Pain Intervention(s): Limited activity within patient's tolerance;Monitored during session;Premedicated before session;Repositioned;Ice applied  Home Living  Prior Functioning/Environment              Frequency           Progress Toward Goals  OT Goals(current goals can now be found in the care plan section)  Progress towards OT goals: Progressing toward goals     Plan      Co-evaluation                 AM-PAC PT "6 Clicks" Daily Activity     Outcome Measure   Help from another person eating meals?: None Help from another person taking care of personal grooming?: A Little Help from another person toileting, which includes using toliet, bedpan, or urinal?: A Little Help from another person bathing (including washing, rinsing, drying)?: A Little Help from another person to put on and taking off regular upper body clothing?: A Little Help from another person to put on and taking off regular lower body clothing?: A Lot 6 Click Score: 18    End of Session    OT Visit Diagnosis: Pain Pain - Right/Left: Left Pain - part of body: Knee   Activity Tolerance Patient tolerated treatment well   Patient Left in bed;with call bell/phone within reach   Nurse Communication          Time: 1027-2536 OT  Time Calculation (min): 9 min  Charges: OT General Charges $OT Visit: 1 Visit OT Treatments $Self Care/Home Management : 8-22 mins  Lesle Chris, OTR/L [phone] 05/02/2017   Brihanna Devenport 05/02/2017, 8:55 AM

## 2017-05-03 NOTE — Addendum Note (Signed)
Addendum  created 05/03/17 2021 by Lillia Abed, MD   Child order released for a procedure order, Intraprocedure Blocks edited, Sign clinical note

## 2017-05-03 NOTE — Anesthesia Procedure Notes (Signed)
Anesthesia Regional Block: Adductor canal block   Pre-Anesthetic Checklist: ,, timeout performed, Correct Patient, Correct Site, Correct Laterality, Correct Procedure, Correct Position, site marked, Risks and benefits discussed,  Surgical consent,  Pre-op evaluation,  At surgeon's request and post-op pain management  Laterality: Left  Prep: chloraprep       Needles:  Injection technique: Single-shot  Needle Type: Echogenic Stimulator Needle     Needle Length: 9cm  Needle Gauge: 21     Additional Needles:   Narrative:  Start time: 04/30/2017 11:45 AM End time: 04/30/2017 11:55 AM Injection made incrementally with aspirations every 5 mL.  Performed by: Personally  Anesthesiologist: Lillia Abed, MD  Additional Notes: Monitors applied. Patient sedated. Sterile prep and drape,hand hygiene and sterile gloves were used. Relevant anatomy identified.Needle position confirmed.Local anesthetic injected incrementally after negative aspiration. Local anesthetic spread visualized around nerve(s). Vascular puncture avoided. No complications. Image printed for medical record.The patient tolerated the procedure well.    Lillia Abed MD

## 2017-05-04 DIAGNOSIS — M25562 Pain in left knee: Secondary | ICD-10-CM | POA: Diagnosis not present

## 2017-05-07 DIAGNOSIS — M25562 Pain in left knee: Secondary | ICD-10-CM | POA: Diagnosis not present

## 2017-05-13 DIAGNOSIS — M25562 Pain in left knee: Secondary | ICD-10-CM | POA: Diagnosis not present

## 2017-05-13 IMAGING — CR DG UGI W/ GASTROGRAFIN
1 series · 1 of 1 positions shown · non-contrast
Comparison: Upper GI 05/16/2014

CLINICAL DATA: 1 day status post Roux-en-Y gastric bypass surgery

EXAM:
UPPER GI SERIES WITH KUB
TECHNIQUE: After obtaining a scout radiograph a routine upper GI series was
performed using water-soluble contrast
FLUOROSCOPY TIME:  Radiation Exposure Index (as provided by the
fluoroscopic device):
If the device does not provide the exposure index:
Fluoroscopy Time (in minutes and seconds):  44 seconds
Number of Acquired Images:  9

[view not recorded]
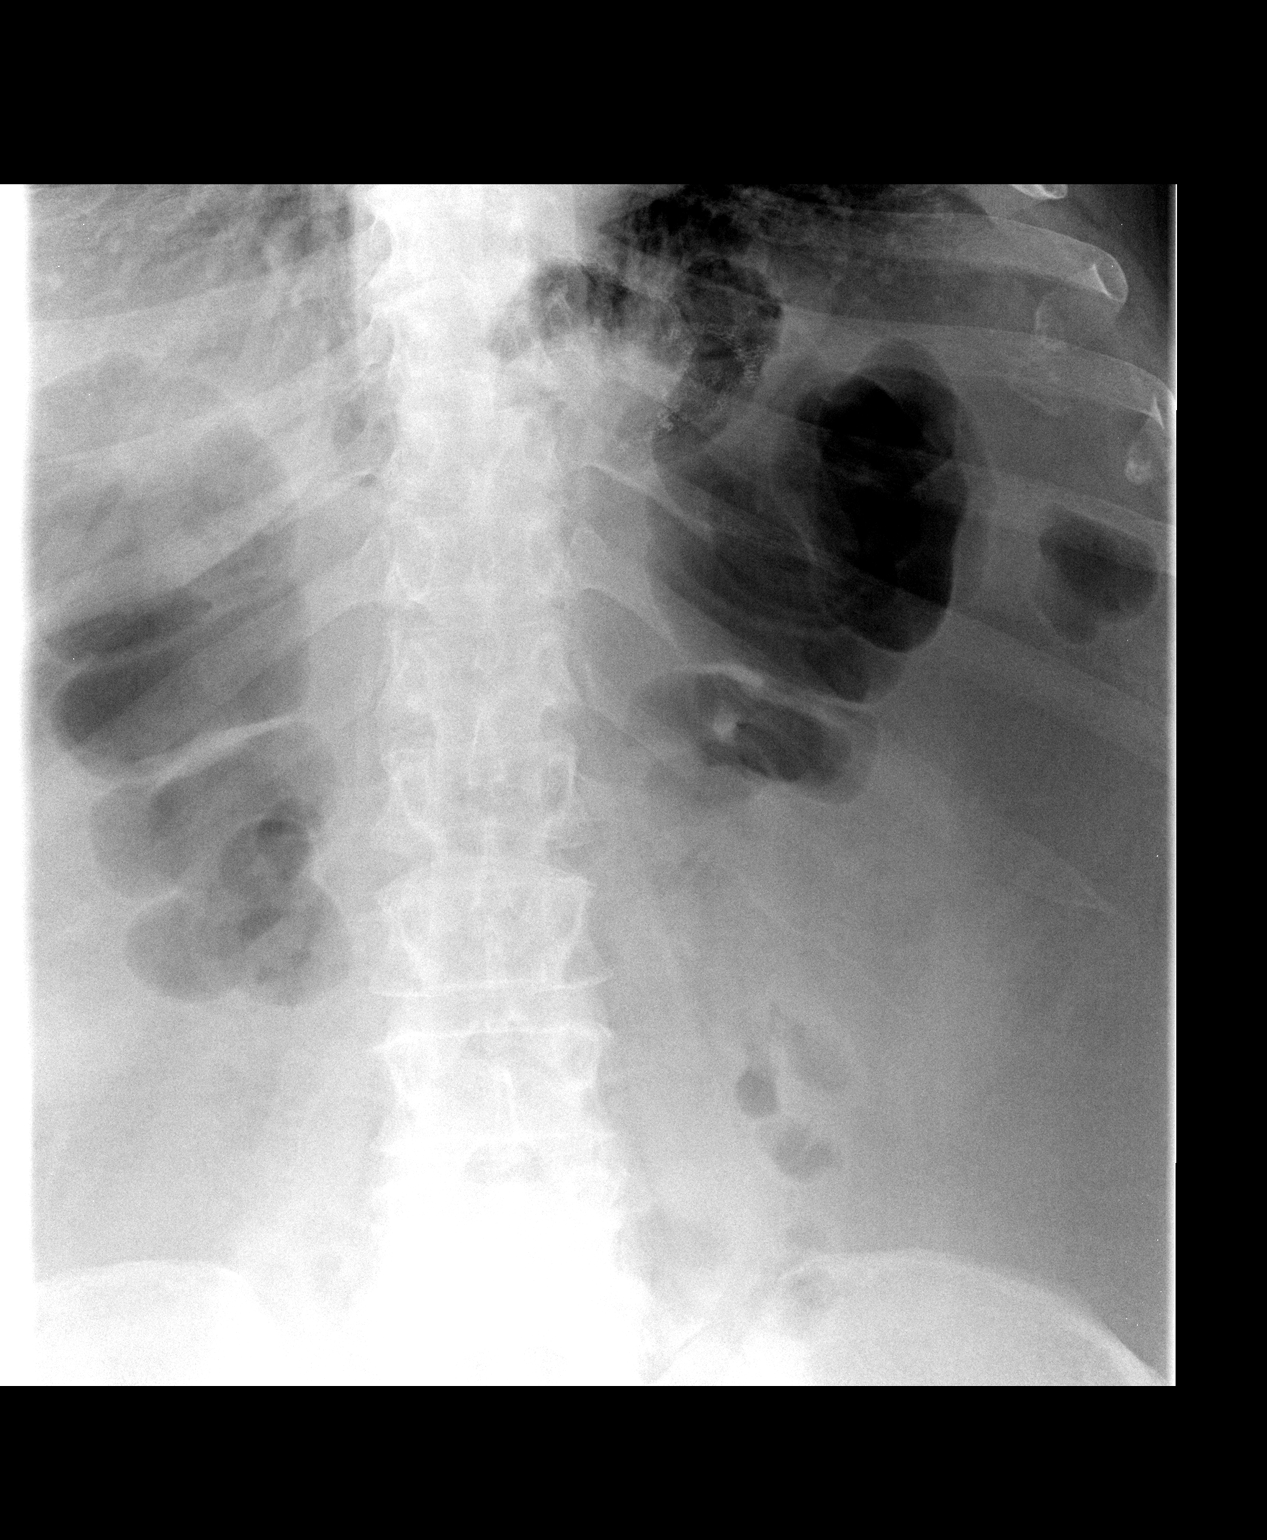

[1 of 1 positions shown; findings below may reference images not displayed]

FINDINGS: Contrast flowed readily through the gastroesophageal junction into
the small gastric remnant. Contrast flowed through the
gastrojejunostomy without evidence obstruction or leak. Mild amount
of retained contrast within the esophagus.
IMPRESSION: No evidence of obstruction or leak following Roux-en-Y gastric
bypass surgery.

These results will be called to the ordering clinician or
representative by the Radiologist Assistant, and communication
documented in the PACS or zVision Dashboard.

## 2017-05-15 ENCOUNTER — Other Ambulatory Visit (HOSPITAL_COMMUNITY): Payer: Self-pay | Admitting: Specialist

## 2017-05-15 ENCOUNTER — Ambulatory Visit (HOSPITAL_COMMUNITY)
Admission: RE | Admit: 2017-05-15 | Discharge: 2017-05-15 | Disposition: A | Discharge status: Home / Self Care | Payer: Medicare Other | Source: Ambulatory Visit | Attending: Internal Medicine | Admitting: Internal Medicine

## 2017-05-15 DIAGNOSIS — M7989 Other specified soft tissue disorders: Principal | ICD-10-CM

## 2017-05-15 DIAGNOSIS — Z96652 Presence of left artificial knee joint: Secondary | ICD-10-CM | POA: Insufficient documentation

## 2017-05-15 DIAGNOSIS — R9389 Abnormal findings on diagnostic imaging of other specified body structures: Secondary | ICD-10-CM | POA: Diagnosis not present

## 2017-05-15 DIAGNOSIS — M79605 Pain in left leg: Secondary | ICD-10-CM | POA: Diagnosis not present

## 2017-05-15 DIAGNOSIS — Z471 Aftercare following joint replacement surgery: Secondary | ICD-10-CM | POA: Insufficient documentation

## 2017-05-15 NOTE — Progress Notes (Addendum)
LLE venous duplex prelim: negative for DVT. Landry Mellow, RDMS, RVT  Attempted call report 2:08 PM Returned call at 2:12 PM gave results to Dr. Theda Sers

## 2017-05-19 DIAGNOSIS — M25562 Pain in left knee: Secondary | ICD-10-CM | POA: Diagnosis not present

## 2017-05-22 DIAGNOSIS — M25562 Pain in left knee: Secondary | ICD-10-CM | POA: Diagnosis not present

## 2017-05-25 DIAGNOSIS — I10 Essential (primary) hypertension: Secondary | ICD-10-CM | POA: Diagnosis not present

## 2017-05-25 DIAGNOSIS — R82998 Other abnormal findings in urine: Secondary | ICD-10-CM | POA: Diagnosis not present

## 2017-05-25 DIAGNOSIS — E538 Deficiency of other specified B group vitamins: Secondary | ICD-10-CM | POA: Diagnosis not present

## 2017-05-25 DIAGNOSIS — M859 Disorder of bone density and structure, unspecified: Secondary | ICD-10-CM | POA: Diagnosis not present

## 2017-05-26 DIAGNOSIS — M25562 Pain in left knee: Secondary | ICD-10-CM | POA: Diagnosis not present

## 2017-05-28 DIAGNOSIS — E538 Deficiency of other specified B group vitamins: Secondary | ICD-10-CM | POA: Diagnosis not present

## 2017-05-29 DIAGNOSIS — M25562 Pain in left knee: Secondary | ICD-10-CM | POA: Diagnosis not present

## 2017-06-01 DIAGNOSIS — Z1389 Encounter for screening for other disorder: Secondary | ICD-10-CM | POA: Diagnosis not present

## 2017-06-01 DIAGNOSIS — M1990 Unspecified osteoarthritis, unspecified site: Secondary | ICD-10-CM | POA: Diagnosis not present

## 2017-06-01 DIAGNOSIS — E538 Deficiency of other specified B group vitamins: Secondary | ICD-10-CM | POA: Diagnosis not present

## 2017-06-01 DIAGNOSIS — Z9884 Bariatric surgery status: Secondary | ICD-10-CM | POA: Diagnosis not present

## 2017-06-01 DIAGNOSIS — Z0000 Encounter for general adult medical examination without abnormal findings: Secondary | ICD-10-CM | POA: Diagnosis not present

## 2017-06-01 DIAGNOSIS — M859 Disorder of bone density and structure, unspecified: Secondary | ICD-10-CM | POA: Diagnosis not present

## 2017-06-01 DIAGNOSIS — Z6826 Body mass index (BMI) 26.0-26.9, adult: Secondary | ICD-10-CM | POA: Diagnosis not present

## 2017-06-01 DIAGNOSIS — I10 Essential (primary) hypertension: Secondary | ICD-10-CM | POA: Diagnosis not present

## 2017-06-01 DIAGNOSIS — F438 Other reactions to severe stress: Secondary | ICD-10-CM | POA: Diagnosis not present

## 2017-06-01 DIAGNOSIS — E559 Vitamin D deficiency, unspecified: Secondary | ICD-10-CM | POA: Diagnosis not present

## 2017-06-01 DIAGNOSIS — D649 Anemia, unspecified: Secondary | ICD-10-CM | POA: Diagnosis not present

## 2017-06-02 DIAGNOSIS — M25562 Pain in left knee: Secondary | ICD-10-CM | POA: Diagnosis not present

## 2017-06-02 DIAGNOSIS — Z1212 Encounter for screening for malignant neoplasm of rectum: Secondary | ICD-10-CM | POA: Diagnosis not present

## 2017-06-05 DIAGNOSIS — M25562 Pain in left knee: Secondary | ICD-10-CM | POA: Diagnosis not present

## 2017-06-09 DIAGNOSIS — M25562 Pain in left knee: Secondary | ICD-10-CM | POA: Diagnosis not present

## 2017-06-15 DIAGNOSIS — M25562 Pain in left knee: Secondary | ICD-10-CM | POA: Diagnosis not present

## 2017-06-19 DIAGNOSIS — Z471 Aftercare following joint replacement surgery: Secondary | ICD-10-CM | POA: Diagnosis not present

## 2017-06-19 DIAGNOSIS — Z96652 Presence of left artificial knee joint: Secondary | ICD-10-CM | POA: Diagnosis not present

## 2017-06-19 DIAGNOSIS — M25562 Pain in left knee: Secondary | ICD-10-CM | POA: Diagnosis not present

## 2017-06-23 DIAGNOSIS — Z471 Aftercare following joint replacement surgery: Secondary | ICD-10-CM | POA: Diagnosis not present

## 2017-06-23 DIAGNOSIS — Z96652 Presence of left artificial knee joint: Secondary | ICD-10-CM | POA: Diagnosis not present

## 2017-06-23 DIAGNOSIS — M25562 Pain in left knee: Secondary | ICD-10-CM | POA: Diagnosis not present

## 2017-06-25 DIAGNOSIS — M25562 Pain in left knee: Secondary | ICD-10-CM | POA: Diagnosis not present

## 2017-06-25 DIAGNOSIS — Z471 Aftercare following joint replacement surgery: Secondary | ICD-10-CM | POA: Diagnosis not present

## 2017-06-25 DIAGNOSIS — Z96652 Presence of left artificial knee joint: Secondary | ICD-10-CM | POA: Diagnosis not present

## 2017-06-29 DIAGNOSIS — Z96652 Presence of left artificial knee joint: Secondary | ICD-10-CM | POA: Diagnosis not present

## 2017-06-29 DIAGNOSIS — Z471 Aftercare following joint replacement surgery: Secondary | ICD-10-CM | POA: Diagnosis not present

## 2017-06-29 DIAGNOSIS — M25562 Pain in left knee: Secondary | ICD-10-CM | POA: Diagnosis not present

## 2017-07-01 DIAGNOSIS — E538 Deficiency of other specified B group vitamins: Secondary | ICD-10-CM | POA: Diagnosis not present

## 2017-07-14 DIAGNOSIS — M25662 Stiffness of left knee, not elsewhere classified: Secondary | ICD-10-CM | POA: Diagnosis not present

## 2017-07-14 DIAGNOSIS — G8918 Other acute postprocedural pain: Secondary | ICD-10-CM | POA: Diagnosis not present

## 2017-07-14 DIAGNOSIS — Z96652 Presence of left artificial knee joint: Secondary | ICD-10-CM | POA: Diagnosis not present

## 2017-07-14 DIAGNOSIS — M24662 Ankylosis, left knee: Secondary | ICD-10-CM | POA: Diagnosis not present

## 2017-07-15 DIAGNOSIS — M25662 Stiffness of left knee, not elsewhere classified: Secondary | ICD-10-CM | POA: Diagnosis not present

## 2017-07-16 DIAGNOSIS — M25662 Stiffness of left knee, not elsewhere classified: Secondary | ICD-10-CM | POA: Diagnosis not present

## 2017-07-17 DIAGNOSIS — M25662 Stiffness of left knee, not elsewhere classified: Secondary | ICD-10-CM | POA: Diagnosis not present

## 2017-07-20 DIAGNOSIS — M25662 Stiffness of left knee, not elsewhere classified: Secondary | ICD-10-CM | POA: Diagnosis not present

## 2017-07-21 DIAGNOSIS — M25662 Stiffness of left knee, not elsewhere classified: Secondary | ICD-10-CM | POA: Diagnosis not present

## 2017-07-24 DIAGNOSIS — M25662 Stiffness of left knee, not elsewhere classified: Secondary | ICD-10-CM | POA: Diagnosis not present

## 2017-07-28 DIAGNOSIS — M25662 Stiffness of left knee, not elsewhere classified: Secondary | ICD-10-CM | POA: Diagnosis not present

## 2017-07-31 DIAGNOSIS — M25662 Stiffness of left knee, not elsewhere classified: Secondary | ICD-10-CM | POA: Diagnosis not present

## 2017-08-04 DIAGNOSIS — M25662 Stiffness of left knee, not elsewhere classified: Secondary | ICD-10-CM | POA: Diagnosis not present

## 2017-08-07 DIAGNOSIS — M25662 Stiffness of left knee, not elsewhere classified: Secondary | ICD-10-CM | POA: Diagnosis not present

## 2017-08-13 DIAGNOSIS — Z9884 Bariatric surgery status: Secondary | ICD-10-CM | POA: Diagnosis not present

## 2017-08-18 DIAGNOSIS — E538 Deficiency of other specified B group vitamins: Secondary | ICD-10-CM | POA: Diagnosis not present

## 2017-08-21 DIAGNOSIS — M1712 Unilateral primary osteoarthritis, left knee: Secondary | ICD-10-CM | POA: Diagnosis not present

## 2017-08-25 DIAGNOSIS — I10 Essential (primary) hypertension: Secondary | ICD-10-CM | POA: Diagnosis not present

## 2017-08-25 DIAGNOSIS — E559 Vitamin D deficiency, unspecified: Secondary | ICD-10-CM | POA: Diagnosis not present

## 2017-08-25 DIAGNOSIS — Z6825 Body mass index (BMI) 25.0-25.9, adult: Secondary | ICD-10-CM | POA: Diagnosis not present

## 2017-08-25 DIAGNOSIS — D6489 Other specified anemias: Secondary | ICD-10-CM | POA: Diagnosis not present

## 2017-09-16 DIAGNOSIS — D126 Benign neoplasm of colon, unspecified: Secondary | ICD-10-CM | POA: Diagnosis not present

## 2017-09-16 DIAGNOSIS — K635 Polyp of colon: Secondary | ICD-10-CM | POA: Diagnosis not present

## 2017-09-16 DIAGNOSIS — Z86010 Personal history of colonic polyps: Secondary | ICD-10-CM | POA: Diagnosis not present

## 2017-09-17 DIAGNOSIS — D126 Benign neoplasm of colon, unspecified: Secondary | ICD-10-CM | POA: Diagnosis not present

## 2017-09-17 DIAGNOSIS — K635 Polyp of colon: Secondary | ICD-10-CM | POA: Diagnosis not present

## 2017-09-29 DIAGNOSIS — E538 Deficiency of other specified B group vitamins: Secondary | ICD-10-CM | POA: Diagnosis not present

## 2017-11-03 DIAGNOSIS — E538 Deficiency of other specified B group vitamins: Secondary | ICD-10-CM | POA: Diagnosis not present

## 2017-11-16 DIAGNOSIS — M170 Bilateral primary osteoarthritis of knee: Secondary | ICD-10-CM | POA: Diagnosis not present

## 2017-12-09 DIAGNOSIS — D649 Anemia, unspecified: Secondary | ICD-10-CM | POA: Diagnosis not present

## 2017-12-09 DIAGNOSIS — E538 Deficiency of other specified B group vitamins: Secondary | ICD-10-CM | POA: Diagnosis not present

## 2017-12-09 DIAGNOSIS — Z6826 Body mass index (BMI) 26.0-26.9, adult: Secondary | ICD-10-CM | POA: Diagnosis not present

## 2017-12-09 DIAGNOSIS — D126 Benign neoplasm of colon, unspecified: Secondary | ICD-10-CM | POA: Diagnosis not present

## 2017-12-09 DIAGNOSIS — Z23 Encounter for immunization: Secondary | ICD-10-CM | POA: Diagnosis not present

## 2017-12-09 DIAGNOSIS — Z9884 Bariatric surgery status: Secondary | ICD-10-CM | POA: Diagnosis not present

## 2017-12-09 DIAGNOSIS — E559 Vitamin D deficiency, unspecified: Secondary | ICD-10-CM | POA: Diagnosis not present

## 2017-12-09 DIAGNOSIS — I10 Essential (primary) hypertension: Secondary | ICD-10-CM | POA: Diagnosis not present

## 2017-12-09 DIAGNOSIS — M859 Disorder of bone density and structure, unspecified: Secondary | ICD-10-CM | POA: Diagnosis not present

## 2018-01-13 DIAGNOSIS — E538 Deficiency of other specified B group vitamins: Secondary | ICD-10-CM | POA: Diagnosis not present

## 2018-02-17 DIAGNOSIS — E538 Deficiency of other specified B group vitamins: Secondary | ICD-10-CM | POA: Diagnosis not present

## 2018-03-10 DIAGNOSIS — Z1231 Encounter for screening mammogram for malignant neoplasm of breast: Secondary | ICD-10-CM | POA: Diagnosis not present

## 2018-03-30 DIAGNOSIS — E538 Deficiency of other specified B group vitamins: Secondary | ICD-10-CM | POA: Diagnosis not present

## 2018-04-29 DIAGNOSIS — L821 Other seborrheic keratosis: Secondary | ICD-10-CM | POA: Diagnosis not present

## 2018-04-29 DIAGNOSIS — D225 Melanocytic nevi of trunk: Secondary | ICD-10-CM | POA: Diagnosis not present

## 2018-04-29 DIAGNOSIS — L814 Other melanin hyperpigmentation: Secondary | ICD-10-CM | POA: Diagnosis not present

## 2018-04-29 DIAGNOSIS — L819 Disorder of pigmentation, unspecified: Secondary | ICD-10-CM | POA: Diagnosis not present

## 2018-04-29 DIAGNOSIS — L820 Inflamed seborrheic keratosis: Secondary | ICD-10-CM | POA: Diagnosis not present

## 2018-04-29 DIAGNOSIS — L570 Actinic keratosis: Secondary | ICD-10-CM | POA: Diagnosis not present

## 2018-04-29 DIAGNOSIS — D1801 Hemangioma of skin and subcutaneous tissue: Secondary | ICD-10-CM | POA: Diagnosis not present

## 2018-05-04 DIAGNOSIS — E538 Deficiency of other specified B group vitamins: Secondary | ICD-10-CM | POA: Diagnosis not present

## 2018-05-17 DIAGNOSIS — Z96652 Presence of left artificial knee joint: Secondary | ICD-10-CM | POA: Diagnosis not present

## 2018-05-17 DIAGNOSIS — Z471 Aftercare following joint replacement surgery: Secondary | ICD-10-CM | POA: Diagnosis not present

## 2018-05-17 DIAGNOSIS — M25511 Pain in right shoulder: Secondary | ICD-10-CM | POA: Diagnosis not present

## 2018-06-03 DIAGNOSIS — E538 Deficiency of other specified B group vitamins: Secondary | ICD-10-CM | POA: Diagnosis not present

## 2018-06-14 DIAGNOSIS — I10 Essential (primary) hypertension: Secondary | ICD-10-CM | POA: Diagnosis not present

## 2018-06-14 DIAGNOSIS — E538 Deficiency of other specified B group vitamins: Secondary | ICD-10-CM | POA: Diagnosis not present

## 2018-06-14 DIAGNOSIS — E559 Vitamin D deficiency, unspecified: Secondary | ICD-10-CM | POA: Diagnosis not present

## 2018-06-21 DIAGNOSIS — D126 Benign neoplasm of colon, unspecified: Secondary | ICD-10-CM | POA: Diagnosis not present

## 2018-06-21 DIAGNOSIS — Z9884 Bariatric surgery status: Secondary | ICD-10-CM | POA: Diagnosis not present

## 2018-06-21 DIAGNOSIS — E559 Vitamin D deficiency, unspecified: Secondary | ICD-10-CM | POA: Diagnosis not present

## 2018-06-21 DIAGNOSIS — M859 Disorder of bone density and structure, unspecified: Secondary | ICD-10-CM | POA: Diagnosis not present

## 2018-06-21 DIAGNOSIS — E538 Deficiency of other specified B group vitamins: Secondary | ICD-10-CM | POA: Diagnosis not present

## 2018-06-21 DIAGNOSIS — Z6826 Body mass index (BMI) 26.0-26.9, adult: Secondary | ICD-10-CM | POA: Diagnosis not present

## 2018-06-21 DIAGNOSIS — R82998 Other abnormal findings in urine: Secondary | ICD-10-CM | POA: Diagnosis not present

## 2018-06-21 DIAGNOSIS — D6489 Other specified anemias: Secondary | ICD-10-CM | POA: Diagnosis not present

## 2018-06-21 DIAGNOSIS — Z1331 Encounter for screening for depression: Secondary | ICD-10-CM | POA: Diagnosis not present

## 2018-06-21 DIAGNOSIS — J3089 Other allergic rhinitis: Secondary | ICD-10-CM | POA: Diagnosis not present

## 2018-06-21 DIAGNOSIS — M1990 Unspecified osteoarthritis, unspecified site: Secondary | ICD-10-CM | POA: Diagnosis not present

## 2018-06-21 DIAGNOSIS — Z0000 Encounter for general adult medical examination without abnormal findings: Secondary | ICD-10-CM | POA: Diagnosis not present

## 2018-06-21 DIAGNOSIS — I10 Essential (primary) hypertension: Secondary | ICD-10-CM | POA: Diagnosis not present

## 2018-06-28 DIAGNOSIS — M25511 Pain in right shoulder: Secondary | ICD-10-CM | POA: Diagnosis not present

## 2018-07-13 DIAGNOSIS — E538 Deficiency of other specified B group vitamins: Secondary | ICD-10-CM | POA: Diagnosis not present

## 2018-08-17 DIAGNOSIS — E538 Deficiency of other specified B group vitamins: Secondary | ICD-10-CM | POA: Diagnosis not present

## 2018-09-21 DIAGNOSIS — E538 Deficiency of other specified B group vitamins: Secondary | ICD-10-CM | POA: Diagnosis not present

## 2018-10-12 DIAGNOSIS — E559 Vitamin D deficiency, unspecified: Secondary | ICD-10-CM | POA: Diagnosis not present

## 2018-10-12 DIAGNOSIS — D649 Anemia, unspecified: Secondary | ICD-10-CM | POA: Diagnosis not present

## 2018-10-26 DIAGNOSIS — E538 Deficiency of other specified B group vitamins: Secondary | ICD-10-CM | POA: Diagnosis not present

## 2018-12-14 DIAGNOSIS — E538 Deficiency of other specified B group vitamins: Secondary | ICD-10-CM | POA: Diagnosis not present

## 2018-12-20 DIAGNOSIS — M1990 Unspecified osteoarthritis, unspecified site: Secondary | ICD-10-CM | POA: Diagnosis not present

## 2018-12-20 DIAGNOSIS — Z9884 Bariatric surgery status: Secondary | ICD-10-CM | POA: Diagnosis not present

## 2018-12-20 DIAGNOSIS — I10 Essential (primary) hypertension: Secondary | ICD-10-CM | POA: Diagnosis not present

## 2018-12-20 DIAGNOSIS — E538 Deficiency of other specified B group vitamins: Secondary | ICD-10-CM | POA: Diagnosis not present

## 2018-12-20 DIAGNOSIS — E559 Vitamin D deficiency, unspecified: Secondary | ICD-10-CM | POA: Diagnosis not present

## 2018-12-20 DIAGNOSIS — D649 Anemia, unspecified: Secondary | ICD-10-CM | POA: Diagnosis not present

## 2018-12-22 DIAGNOSIS — D219 Benign neoplasm of connective and other soft tissue, unspecified: Secondary | ICD-10-CM | POA: Diagnosis not present

## 2018-12-22 DIAGNOSIS — M79642 Pain in left hand: Secondary | ICD-10-CM | POA: Diagnosis not present

## 2019-01-01 DIAGNOSIS — Z23 Encounter for immunization: Secondary | ICD-10-CM | POA: Diagnosis not present

## 2019-01-27 DIAGNOSIS — E538 Deficiency of other specified B group vitamins: Secondary | ICD-10-CM | POA: Diagnosis not present

## 2019-02-18 DIAGNOSIS — M859 Disorder of bone density and structure, unspecified: Secondary | ICD-10-CM | POA: Diagnosis not present

## 2019-03-02 DIAGNOSIS — E538 Deficiency of other specified B group vitamins: Secondary | ICD-10-CM | POA: Diagnosis not present

## 2019-04-06 DIAGNOSIS — E538 Deficiency of other specified B group vitamins: Secondary | ICD-10-CM | POA: Diagnosis not present

## 2019-05-10 DIAGNOSIS — B372 Candidiasis of skin and nail: Secondary | ICD-10-CM | POA: Diagnosis not present

## 2019-05-10 DIAGNOSIS — Z01419 Encounter for gynecological examination (general) (routine) without abnormal findings: Secondary | ICD-10-CM | POA: Diagnosis not present

## 2019-05-10 DIAGNOSIS — N941 Unspecified dyspareunia: Secondary | ICD-10-CM | POA: Diagnosis not present

## 2019-05-10 DIAGNOSIS — K649 Unspecified hemorrhoids: Secondary | ICD-10-CM | POA: Diagnosis not present

## 2019-05-17 DIAGNOSIS — E538 Deficiency of other specified B group vitamins: Secondary | ICD-10-CM | POA: Diagnosis not present

## 2019-05-20 ENCOUNTER — Ambulatory Visit: Payer: Medicare HMO

## 2019-05-20 ENCOUNTER — Ambulatory Visit: Payer: Medicare HMO | Attending: Internal Medicine

## 2019-05-20 DIAGNOSIS — Z23 Encounter for immunization: Secondary | ICD-10-CM | POA: Insufficient documentation

## 2019-05-20 NOTE — Progress Notes (Signed)
   Covid-19 Vaccination Clinic  Name:  Shawna Ellison    MRN: [id number] DOB: 07-Sep-1945  05/20/2019  [name] was observed post Covid-19 immunization for 15 minutes without incidence. She was provided with Vaccine Information Sheet and instruction to access the V-Safe system.   [name] was instructed to call 911 with any severe reactions post vaccine: Marland Kitchen Difficulty breathing  . Swelling of your face and throat  . A fast heartbeat  . A bad rash all over your body  . Dizziness and weakness    Immunizations Administered    Name Date Dose VIS Date Route   Pfizer COVID-19 Vaccine 05/20/2019  9:07 AM 0.3 mL 03/04/2019 Intramuscular   Manufacturer: [employer]   Lot: [id number]   [place]: [id number]

## 2019-06-14 ENCOUNTER — Ambulatory Visit: Payer: Medicare HMO | Attending: Internal Medicine

## 2019-06-14 DIAGNOSIS — Z23 Encounter for immunization: Secondary | ICD-10-CM

## 2019-06-14 DIAGNOSIS — E538 Deficiency of other specified B group vitamins: Secondary | ICD-10-CM | POA: Diagnosis not present

## 2019-06-14 NOTE — Progress Notes (Signed)
   Covid-19 Vaccination Clinic  Name:  Shawna Ellison    MRN: [id number] DOB: 1946-01-10  06/14/2019  [name] was observed post Covid-19 immunization for 15 minutes without incident. She was provided with Vaccine Information Sheet and instruction to access the V-Safe system.   [name] was instructed to call 911 with any severe reactions post vaccine: Marland Kitchen Difficulty breathing  . Swelling of face and throat  . A fast heartbeat  . A bad rash all over body  . Dizziness and weakness   Immunizations Administered    Name Date Dose VIS Date Route   Pfizer COVID-19 Vaccine 06/14/2019 10:14 AM 0.3 mL 03/04/2019 Intramuscular   Manufacturer: [employer]   Lot: [id number]   [place]: [id number]

## 2019-06-30 DIAGNOSIS — L821 Other seborrheic keratosis: Secondary | ICD-10-CM | POA: Diagnosis not present

## 2019-06-30 DIAGNOSIS — L570 Actinic keratosis: Secondary | ICD-10-CM | POA: Diagnosis not present

## 2019-06-30 DIAGNOSIS — D225 Melanocytic nevi of trunk: Secondary | ICD-10-CM | POA: Diagnosis not present

## 2019-06-30 DIAGNOSIS — D1801 Hemangioma of skin and subcutaneous tissue: Secondary | ICD-10-CM | POA: Diagnosis not present

## 2019-06-30 DIAGNOSIS — L814 Other melanin hyperpigmentation: Secondary | ICD-10-CM | POA: Diagnosis not present

## 2019-07-20 DIAGNOSIS — E538 Deficiency of other specified B group vitamins: Secondary | ICD-10-CM | POA: Diagnosis not present

## 2019-08-08 DIAGNOSIS — R7989 Other specified abnormal findings of blood chemistry: Secondary | ICD-10-CM | POA: Diagnosis not present

## 2019-08-08 DIAGNOSIS — E559 Vitamin D deficiency, unspecified: Secondary | ICD-10-CM | POA: Diagnosis not present

## 2019-08-08 DIAGNOSIS — E538 Deficiency of other specified B group vitamins: Secondary | ICD-10-CM | POA: Diagnosis not present

## 2019-08-08 DIAGNOSIS — I10 Essential (primary) hypertension: Secondary | ICD-10-CM | POA: Diagnosis not present

## 2019-08-15 DIAGNOSIS — D126 Benign neoplasm of colon, unspecified: Secondary | ICD-10-CM | POA: Diagnosis not present

## 2019-08-15 DIAGNOSIS — D649 Anemia, unspecified: Secondary | ICD-10-CM | POA: Diagnosis not present

## 2019-08-15 DIAGNOSIS — E559 Vitamin D deficiency, unspecified: Secondary | ICD-10-CM | POA: Diagnosis not present

## 2019-08-15 DIAGNOSIS — Z9884 Bariatric surgery status: Secondary | ICD-10-CM | POA: Diagnosis not present

## 2019-08-15 DIAGNOSIS — M1990 Unspecified osteoarthritis, unspecified site: Secondary | ICD-10-CM | POA: Diagnosis not present

## 2019-08-15 DIAGNOSIS — R82998 Other abnormal findings in urine: Secondary | ICD-10-CM | POA: Diagnosis not present

## 2019-08-15 DIAGNOSIS — I10 Essential (primary) hypertension: Secondary | ICD-10-CM | POA: Diagnosis not present

## 2019-08-15 DIAGNOSIS — E538 Deficiency of other specified B group vitamins: Secondary | ICD-10-CM | POA: Diagnosis not present

## 2019-08-15 DIAGNOSIS — J309 Allergic rhinitis, unspecified: Secondary | ICD-10-CM | POA: Diagnosis not present

## 2019-08-15 DIAGNOSIS — Z0000 Encounter for general adult medical examination without abnormal findings: Secondary | ICD-10-CM | POA: Diagnosis not present

## 2019-08-17 DIAGNOSIS — Z1212 Encounter for screening for malignant neoplasm of rectum: Secondary | ICD-10-CM | POA: Diagnosis not present

## 2019-08-19 DIAGNOSIS — R7989 Other specified abnormal findings of blood chemistry: Secondary | ICD-10-CM | POA: Diagnosis not present

## 2019-09-02 DIAGNOSIS — M810 Age-related osteoporosis without current pathological fracture: Secondary | ICD-10-CM | POA: Diagnosis not present

## 2019-10-14 DIAGNOSIS — E538 Deficiency of other specified B group vitamins: Secondary | ICD-10-CM | POA: Diagnosis not present

## 2019-10-14 DIAGNOSIS — D649 Anemia, unspecified: Secondary | ICD-10-CM | POA: Diagnosis not present

## 2019-10-14 DIAGNOSIS — D126 Benign neoplasm of colon, unspecified: Secondary | ICD-10-CM | POA: Diagnosis not present

## 2019-10-14 DIAGNOSIS — Z9884 Bariatric surgery status: Secondary | ICD-10-CM | POA: Diagnosis not present

## 2019-10-14 DIAGNOSIS — I10 Essential (primary) hypertension: Secondary | ICD-10-CM | POA: Diagnosis not present

## 2019-12-19 DIAGNOSIS — Z23 Encounter for immunization: Secondary | ICD-10-CM | POA: Diagnosis not present

## 2020-01-10 DIAGNOSIS — D649 Anemia, unspecified: Secondary | ICD-10-CM | POA: Diagnosis not present

## 2020-02-02 DIAGNOSIS — F438 Other reactions to severe stress: Secondary | ICD-10-CM | POA: Diagnosis not present

## 2020-02-02 DIAGNOSIS — D649 Anemia, unspecified: Secondary | ICD-10-CM | POA: Diagnosis not present

## 2020-02-02 DIAGNOSIS — M8580 Other specified disorders of bone density and structure, unspecified site: Secondary | ICD-10-CM | POA: Diagnosis not present

## 2020-02-02 DIAGNOSIS — I10 Essential (primary) hypertension: Secondary | ICD-10-CM | POA: Diagnosis not present

## 2020-02-02 DIAGNOSIS — M1990 Unspecified osteoarthritis, unspecified site: Secondary | ICD-10-CM | POA: Diagnosis not present

## 2020-02-02 DIAGNOSIS — E538 Deficiency of other specified B group vitamins: Secondary | ICD-10-CM | POA: Diagnosis not present

## 2020-02-02 DIAGNOSIS — Z9884 Bariatric surgery status: Secondary | ICD-10-CM | POA: Diagnosis not present

## 2020-02-02 DIAGNOSIS — D126 Benign neoplasm of colon, unspecified: Secondary | ICD-10-CM | POA: Diagnosis not present

## 2020-02-02 DIAGNOSIS — E559 Vitamin D deficiency, unspecified: Secondary | ICD-10-CM | POA: Diagnosis not present

## 2020-03-12 DIAGNOSIS — H5203 Hypermetropia, bilateral: Secondary | ICD-10-CM | POA: Diagnosis not present

## 2020-03-12 DIAGNOSIS — H524 Presbyopia: Secondary | ICD-10-CM | POA: Diagnosis not present

## 2020-03-12 DIAGNOSIS — H52209 Unspecified astigmatism, unspecified eye: Secondary | ICD-10-CM | POA: Diagnosis not present

## 2020-03-12 DIAGNOSIS — H2513 Age-related nuclear cataract, bilateral: Secondary | ICD-10-CM | POA: Diagnosis not present

## 2020-03-21 DIAGNOSIS — Z96653 Presence of artificial knee joint, bilateral: Secondary | ICD-10-CM | POA: Diagnosis not present

## 2020-03-27 DIAGNOSIS — E538 Deficiency of other specified B group vitamins: Secondary | ICD-10-CM | POA: Diagnosis not present

## 2020-03-29 DIAGNOSIS — Z96653 Presence of artificial knee joint, bilateral: Secondary | ICD-10-CM | POA: Diagnosis not present

## 2020-03-30 DIAGNOSIS — Z1231 Encounter for screening mammogram for malignant neoplasm of breast: Secondary | ICD-10-CM | POA: Diagnosis not present

## 2020-04-14 DIAGNOSIS — M25562 Pain in left knee: Secondary | ICD-10-CM | POA: Diagnosis not present

## 2020-05-03 DIAGNOSIS — E538 Deficiency of other specified B group vitamins: Secondary | ICD-10-CM | POA: Diagnosis not present

## 2020-05-04 DIAGNOSIS — Z96652 Presence of left artificial knee joint: Secondary | ICD-10-CM | POA: Diagnosis not present

## 2020-05-11 DIAGNOSIS — M24662 Ankylosis, left knee: Secondary | ICD-10-CM | POA: Diagnosis not present

## 2020-05-11 DIAGNOSIS — T8484XA Pain due to internal orthopedic prosthetic devices, implants and grafts, initial encounter: Secondary | ICD-10-CM | POA: Diagnosis not present

## 2020-09-26 DIAGNOSIS — E559 Vitamin D deficiency, unspecified: Secondary | ICD-10-CM | POA: Diagnosis not present

## 2020-09-26 DIAGNOSIS — E538 Deficiency of other specified B group vitamins: Secondary | ICD-10-CM | POA: Diagnosis not present

## 2020-09-26 DIAGNOSIS — I10 Essential (primary) hypertension: Secondary | ICD-10-CM | POA: Diagnosis not present

## 2020-10-03 DIAGNOSIS — I10 Essential (primary) hypertension: Secondary | ICD-10-CM | POA: Diagnosis not present

## 2020-10-03 DIAGNOSIS — F438 Other reactions to severe stress: Secondary | ICD-10-CM | POA: Diagnosis not present

## 2020-10-03 DIAGNOSIS — R945 Abnormal results of liver function studies: Secondary | ICD-10-CM | POA: Diagnosis not present

## 2020-10-03 DIAGNOSIS — R82998 Other abnormal findings in urine: Secondary | ICD-10-CM | POA: Diagnosis not present

## 2020-10-03 DIAGNOSIS — M1990 Unspecified osteoarthritis, unspecified site: Secondary | ICD-10-CM | POA: Diagnosis not present

## 2020-10-03 DIAGNOSIS — Z9884 Bariatric surgery status: Secondary | ICD-10-CM | POA: Diagnosis not present

## 2020-10-03 DIAGNOSIS — Z1212 Encounter for screening for malignant neoplasm of rectum: Secondary | ICD-10-CM | POA: Diagnosis not present

## 2020-10-03 DIAGNOSIS — M8580 Other specified disorders of bone density and structure, unspecified site: Secondary | ICD-10-CM | POA: Diagnosis not present

## 2020-10-03 DIAGNOSIS — D649 Anemia, unspecified: Secondary | ICD-10-CM | POA: Diagnosis not present

## 2020-10-03 DIAGNOSIS — E559 Vitamin D deficiency, unspecified: Secondary | ICD-10-CM | POA: Diagnosis not present

## 2020-10-03 DIAGNOSIS — Z0000 Encounter for general adult medical examination without abnormal findings: Secondary | ICD-10-CM | POA: Diagnosis not present

## 2020-10-19 ENCOUNTER — Other Ambulatory Visit: Payer: Self-pay | Admitting: Internal Medicine

## 2020-10-19 DIAGNOSIS — R7989 Other specified abnormal findings of blood chemistry: Secondary | ICD-10-CM

## 2020-10-19 DIAGNOSIS — R945 Abnormal results of liver function studies: Secondary | ICD-10-CM

## 2020-10-26 DIAGNOSIS — L814 Other melanin hyperpigmentation: Secondary | ICD-10-CM | POA: Diagnosis not present

## 2020-10-26 DIAGNOSIS — D485 Neoplasm of uncertain behavior of skin: Secondary | ICD-10-CM | POA: Diagnosis not present

## 2020-10-26 DIAGNOSIS — L304 Erythema intertrigo: Secondary | ICD-10-CM | POA: Diagnosis not present

## 2020-10-26 DIAGNOSIS — L570 Actinic keratosis: Secondary | ICD-10-CM | POA: Diagnosis not present

## 2020-10-26 DIAGNOSIS — C44519 Basal cell carcinoma of skin of other part of trunk: Secondary | ICD-10-CM | POA: Diagnosis not present

## 2020-10-26 DIAGNOSIS — Z85828 Personal history of other malignant neoplasm of skin: Secondary | ICD-10-CM | POA: Diagnosis not present

## 2020-10-31 ENCOUNTER — Other Ambulatory Visit: Payer: Self-pay

## 2020-10-31 ENCOUNTER — Ambulatory Visit
Admission: RE | Admit: 2020-10-31 | Discharge: 2020-10-31 | Disposition: A | Discharge status: Home / Self Care | Payer: Medicare HMO | Source: Ambulatory Visit | Attending: Internal Medicine | Admitting: Internal Medicine

## 2020-10-31 DIAGNOSIS — R7989 Other specified abnormal findings of blood chemistry: Secondary | ICD-10-CM

## 2020-10-31 DIAGNOSIS — R945 Abnormal results of liver function studies: Secondary | ICD-10-CM

## 2020-11-22 DIAGNOSIS — Z23 Encounter for immunization: Secondary | ICD-10-CM | POA: Diagnosis not present

## 2021-04-01 DIAGNOSIS — Z1231 Encounter for screening mammogram for malignant neoplasm of breast: Secondary | ICD-10-CM | POA: Diagnosis not present

## 2021-10-23 DIAGNOSIS — E559 Vitamin D deficiency, unspecified: Secondary | ICD-10-CM | POA: Diagnosis not present

## 2021-10-23 DIAGNOSIS — E538 Deficiency of other specified B group vitamins: Secondary | ICD-10-CM | POA: Diagnosis not present

## 2021-10-23 DIAGNOSIS — D649 Anemia, unspecified: Secondary | ICD-10-CM | POA: Diagnosis not present

## 2021-10-23 DIAGNOSIS — Z9884 Bariatric surgery status: Secondary | ICD-10-CM | POA: Diagnosis not present

## 2021-10-23 DIAGNOSIS — Z0000 Encounter for general adult medical examination without abnormal findings: Secondary | ICD-10-CM | POA: Diagnosis not present

## 2021-10-23 DIAGNOSIS — R7989 Other specified abnormal findings of blood chemistry: Secondary | ICD-10-CM | POA: Diagnosis not present

## 2021-10-23 DIAGNOSIS — I10 Essential (primary) hypertension: Secondary | ICD-10-CM | POA: Diagnosis not present

## 2021-10-30 DIAGNOSIS — Z23 Encounter for immunization: Secondary | ICD-10-CM | POA: Diagnosis not present

## 2021-10-30 DIAGNOSIS — E538 Deficiency of other specified B group vitamins: Secondary | ICD-10-CM | POA: Diagnosis not present

## 2021-10-30 DIAGNOSIS — E559 Vitamin D deficiency, unspecified: Secondary | ICD-10-CM | POA: Diagnosis not present

## 2021-10-30 DIAGNOSIS — Z0000 Encounter for general adult medical examination without abnormal findings: Secondary | ICD-10-CM | POA: Diagnosis not present

## 2021-10-30 DIAGNOSIS — Z9884 Bariatric surgery status: Secondary | ICD-10-CM | POA: Diagnosis not present

## 2021-10-30 DIAGNOSIS — M8580 Other specified disorders of bone density and structure, unspecified site: Secondary | ICD-10-CM | POA: Diagnosis not present

## 2021-10-30 DIAGNOSIS — I10 Essential (primary) hypertension: Secondary | ICD-10-CM | POA: Diagnosis not present

## 2021-10-30 DIAGNOSIS — N393 Stress incontinence (female) (male): Secondary | ICD-10-CM | POA: Diagnosis not present

## 2021-11-01 DIAGNOSIS — R82998 Other abnormal findings in urine: Secondary | ICD-10-CM | POA: Diagnosis not present

## 2021-11-04 DIAGNOSIS — Z0000 Encounter for general adult medical examination without abnormal findings: Secondary | ICD-10-CM | POA: Diagnosis not present

## 2021-11-04 DIAGNOSIS — E538 Deficiency of other specified B group vitamins: Secondary | ICD-10-CM | POA: Diagnosis not present

## 2021-11-05 DIAGNOSIS — L578 Other skin changes due to chronic exposure to nonionizing radiation: Secondary | ICD-10-CM | POA: Diagnosis not present

## 2021-11-05 DIAGNOSIS — L438 Other lichen planus: Secondary | ICD-10-CM | POA: Diagnosis not present

## 2021-11-05 DIAGNOSIS — L570 Actinic keratosis: Secondary | ICD-10-CM | POA: Diagnosis not present

## 2021-11-05 DIAGNOSIS — L821 Other seborrheic keratosis: Secondary | ICD-10-CM | POA: Diagnosis not present

## 2021-11-05 DIAGNOSIS — Z85828 Personal history of other malignant neoplasm of skin: Secondary | ICD-10-CM | POA: Diagnosis not present

## 2022-01-22 DIAGNOSIS — Z01419 Encounter for gynecological examination (general) (routine) without abnormal findings: Secondary | ICD-10-CM | POA: Diagnosis not present

## 2022-01-22 DIAGNOSIS — Z124 Encounter for screening for malignant neoplasm of cervix: Secondary | ICD-10-CM | POA: Diagnosis not present

## 2022-01-23 DIAGNOSIS — Z23 Encounter for immunization: Secondary | ICD-10-CM | POA: Diagnosis not present

## 2022-01-23 DIAGNOSIS — E538 Deficiency of other specified B group vitamins: Secondary | ICD-10-CM | POA: Diagnosis not present

## 2022-04-03 DIAGNOSIS — Z1231 Encounter for screening mammogram for malignant neoplasm of breast: Secondary | ICD-10-CM | POA: Diagnosis not present

## 2022-04-17 DIAGNOSIS — M810 Age-related osteoporosis without current pathological fracture: Secondary | ICD-10-CM | POA: Diagnosis not present

## 2022-04-17 DIAGNOSIS — M8589 Other specified disorders of bone density and structure, multiple sites: Secondary | ICD-10-CM | POA: Diagnosis not present

## 2022-05-06 DIAGNOSIS — D649 Anemia, unspecified: Secondary | ICD-10-CM | POA: Diagnosis not present

## 2022-05-06 DIAGNOSIS — R7989 Other specified abnormal findings of blood chemistry: Secondary | ICD-10-CM | POA: Diagnosis not present

## 2022-05-06 DIAGNOSIS — Z9884 Bariatric surgery status: Secondary | ICD-10-CM | POA: Diagnosis not present

## 2022-05-06 DIAGNOSIS — I10 Essential (primary) hypertension: Secondary | ICD-10-CM | POA: Diagnosis not present

## 2022-05-21 DIAGNOSIS — E538 Deficiency of other specified B group vitamins: Secondary | ICD-10-CM | POA: Diagnosis not present

## 2022-06-05 DIAGNOSIS — K648 Other hemorrhoids: Secondary | ICD-10-CM | POA: Diagnosis not present

## 2022-06-05 DIAGNOSIS — Z9884 Bariatric surgery status: Secondary | ICD-10-CM | POA: Diagnosis not present

## 2022-06-05 DIAGNOSIS — D509 Iron deficiency anemia, unspecified: Secondary | ICD-10-CM | POA: Diagnosis not present

## 2022-06-05 DIAGNOSIS — Z86010 Personal history of colonic polyps: Secondary | ICD-10-CM | POA: Diagnosis not present

## 2022-06-05 DIAGNOSIS — Z09 Encounter for follow-up examination after completed treatment for conditions other than malignant neoplasm: Secondary | ICD-10-CM | POA: Diagnosis not present

## 2022-06-12 DIAGNOSIS — I10 Essential (primary) hypertension: Secondary | ICD-10-CM | POA: Diagnosis not present

## 2022-08-05 DIAGNOSIS — E538 Deficiency of other specified B group vitamins: Secondary | ICD-10-CM | POA: Diagnosis not present

## 2022-10-23 DIAGNOSIS — E538 Deficiency of other specified B group vitamins: Secondary | ICD-10-CM | POA: Diagnosis not present

## 2022-11-06 DIAGNOSIS — M67431 Ganglion, right wrist: Secondary | ICD-10-CM | POA: Diagnosis not present

## 2022-11-06 DIAGNOSIS — M25531 Pain in right wrist: Secondary | ICD-10-CM | POA: Diagnosis not present

## 2022-11-06 DIAGNOSIS — M79641 Pain in right hand: Secondary | ICD-10-CM | POA: Diagnosis not present

## 2022-11-10 DIAGNOSIS — L565 Disseminated superficial actinic porokeratosis (DSAP): Secondary | ICD-10-CM | POA: Diagnosis not present

## 2022-11-10 DIAGNOSIS — L820 Inflamed seborrheic keratosis: Secondary | ICD-10-CM | POA: Diagnosis not present

## 2022-11-10 DIAGNOSIS — D692 Other nonthrombocytopenic purpura: Secondary | ICD-10-CM | POA: Diagnosis not present

## 2022-11-10 DIAGNOSIS — L821 Other seborrheic keratosis: Secondary | ICD-10-CM | POA: Diagnosis not present

## 2022-11-10 DIAGNOSIS — L570 Actinic keratosis: Secondary | ICD-10-CM | POA: Diagnosis not present

## 2022-11-10 DIAGNOSIS — Z85828 Personal history of other malignant neoplasm of skin: Secondary | ICD-10-CM | POA: Diagnosis not present

## 2022-11-22 DIAGNOSIS — M25531 Pain in right wrist: Secondary | ICD-10-CM | POA: Diagnosis not present

## 2022-11-25 DIAGNOSIS — Z23 Encounter for immunization: Secondary | ICD-10-CM | POA: Diagnosis not present

## 2022-12-01 DIAGNOSIS — M13831 Other specified arthritis, right wrist: Secondary | ICD-10-CM | POA: Diagnosis not present

## 2022-12-01 DIAGNOSIS — M67431 Ganglion, right wrist: Secondary | ICD-10-CM | POA: Diagnosis not present

## 2022-12-24 DIAGNOSIS — Z0000 Encounter for general adult medical examination without abnormal findings: Secondary | ICD-10-CM | POA: Diagnosis not present

## 2022-12-24 DIAGNOSIS — E611 Iron deficiency: Secondary | ICD-10-CM | POA: Diagnosis not present

## 2022-12-24 DIAGNOSIS — Z1389 Encounter for screening for other disorder: Secondary | ICD-10-CM | POA: Diagnosis not present

## 2022-12-24 DIAGNOSIS — E559 Vitamin D deficiency, unspecified: Secondary | ICD-10-CM | POA: Diagnosis not present

## 2022-12-24 DIAGNOSIS — E538 Deficiency of other specified B group vitamins: Secondary | ICD-10-CM | POA: Diagnosis not present

## 2022-12-24 DIAGNOSIS — Z1212 Encounter for screening for malignant neoplasm of rectum: Secondary | ICD-10-CM | POA: Diagnosis not present

## 2022-12-24 DIAGNOSIS — I10 Essential (primary) hypertension: Secondary | ICD-10-CM | POA: Diagnosis not present

## 2022-12-31 DIAGNOSIS — E559 Vitamin D deficiency, unspecified: Secondary | ICD-10-CM | POA: Diagnosis not present

## 2022-12-31 DIAGNOSIS — R82998 Other abnormal findings in urine: Secondary | ICD-10-CM | POA: Diagnosis not present

## 2022-12-31 DIAGNOSIS — M79641 Pain in right hand: Secondary | ICD-10-CM | POA: Diagnosis not present

## 2022-12-31 DIAGNOSIS — Z0000 Encounter for general adult medical examination without abnormal findings: Secondary | ICD-10-CM | POA: Diagnosis not present

## 2022-12-31 DIAGNOSIS — M810 Age-related osteoporosis without current pathological fracture: Secondary | ICD-10-CM | POA: Diagnosis not present

## 2022-12-31 DIAGNOSIS — Z1331 Encounter for screening for depression: Secondary | ICD-10-CM | POA: Diagnosis not present

## 2022-12-31 DIAGNOSIS — I10 Essential (primary) hypertension: Secondary | ICD-10-CM | POA: Diagnosis not present

## 2022-12-31 DIAGNOSIS — E538 Deficiency of other specified B group vitamins: Secondary | ICD-10-CM | POA: Diagnosis not present

## 2022-12-31 DIAGNOSIS — D126 Benign neoplasm of colon, unspecified: Secondary | ICD-10-CM | POA: Diagnosis not present

## 2022-12-31 DIAGNOSIS — Z9884 Bariatric surgery status: Secondary | ICD-10-CM | POA: Diagnosis not present

## 2022-12-31 DIAGNOSIS — Z1339 Encounter for screening examination for other mental health and behavioral disorders: Secondary | ICD-10-CM | POA: Diagnosis not present

## 2023-01-01 ENCOUNTER — Other Ambulatory Visit (HOSPITAL_COMMUNITY): Payer: Self-pay | Admitting: *Deleted

## 2023-01-05 ENCOUNTER — Ambulatory Visit (HOSPITAL_COMMUNITY)
Admission: RE | Admit: 2023-01-05 | Discharge: 2023-01-05 | Disposition: A | Discharge status: Home / Self Care | Payer: Medicare HMO | Source: Ambulatory Visit | Attending: Internal Medicine | Admitting: Internal Medicine

## 2023-01-05 DIAGNOSIS — M810 Age-related osteoporosis without current pathological fracture: Secondary | ICD-10-CM | POA: Insufficient documentation

## 2023-01-05 MED ORDER — ZOLEDRONIC ACID 5 MG/100ML IV SOLN: 5.0000 mg | Freq: Once | INTRAVENOUS | Status: AC

## 2023-01-05 MED ORDER — ZOLEDRONIC ACID 5 MG/100ML IV SOLN
INTRAVENOUS | Status: AC
  Administered 2023-01-05: 5 mg via INTRAVENOUS
  Filled 2023-01-05: qty 100

## 2023-01-16 DIAGNOSIS — M67431 Ganglion, right wrist: Secondary | ICD-10-CM | POA: Diagnosis not present

## 2023-01-16 DIAGNOSIS — G5621 Lesion of ulnar nerve, right upper limb: Secondary | ICD-10-CM | POA: Diagnosis not present

## 2023-01-16 DIAGNOSIS — M71331 Other bursal cyst, right wrist: Secondary | ICD-10-CM | POA: Diagnosis not present

## 2023-01-16 DIAGNOSIS — M19031 Primary osteoarthritis, right wrist: Secondary | ICD-10-CM | POA: Diagnosis not present

## 2023-01-16 DIAGNOSIS — G894 Chronic pain syndrome: Secondary | ICD-10-CM | POA: Diagnosis not present

## 2023-01-16 DIAGNOSIS — M25531 Pain in right wrist: Secondary | ICD-10-CM | POA: Diagnosis not present

## 2023-01-29 DIAGNOSIS — Z4789 Encounter for other orthopedic aftercare: Secondary | ICD-10-CM | POA: Diagnosis not present

## 2023-01-29 DIAGNOSIS — M25641 Stiffness of right hand, not elsewhere classified: Secondary | ICD-10-CM | POA: Diagnosis not present

## 2023-02-06 DIAGNOSIS — M25641 Stiffness of right hand, not elsewhere classified: Secondary | ICD-10-CM | POA: Diagnosis not present

## 2023-02-26 DIAGNOSIS — H5203 Hypermetropia, bilateral: Secondary | ICD-10-CM | POA: Diagnosis not present

## 2023-02-26 DIAGNOSIS — H524 Presbyopia: Secondary | ICD-10-CM | POA: Diagnosis not present

## 2023-02-26 DIAGNOSIS — H52209 Unspecified astigmatism, unspecified eye: Secondary | ICD-10-CM | POA: Diagnosis not present

## 2023-04-07 DIAGNOSIS — Z1231 Encounter for screening mammogram for malignant neoplasm of breast: Secondary | ICD-10-CM | POA: Diagnosis not present

## 2023-04-21 DIAGNOSIS — E538 Deficiency of other specified B group vitamins: Secondary | ICD-10-CM | POA: Diagnosis not present

## 2023-05-23 DIAGNOSIS — M1990 Unspecified osteoarthritis, unspecified site: Secondary | ICD-10-CM | POA: Diagnosis not present

## 2023-05-23 DIAGNOSIS — E663 Overweight: Secondary | ICD-10-CM | POA: Diagnosis not present

## 2023-05-23 DIAGNOSIS — M62838 Other muscle spasm: Secondary | ICD-10-CM | POA: Diagnosis not present

## 2023-05-23 DIAGNOSIS — F419 Anxiety disorder, unspecified: Secondary | ICD-10-CM | POA: Diagnosis not present

## 2023-05-23 DIAGNOSIS — M810 Age-related osteoporosis without current pathological fracture: Secondary | ICD-10-CM | POA: Diagnosis not present

## 2023-08-12 DIAGNOSIS — E538 Deficiency of other specified B group vitamins: Secondary | ICD-10-CM | POA: Diagnosis not present

## 2023-10-27 DIAGNOSIS — E538 Deficiency of other specified B group vitamins: Secondary | ICD-10-CM | POA: Diagnosis not present

## 2023-12-10 DIAGNOSIS — N3642 Intrinsic sphincter deficiency (ISD): Secondary | ICD-10-CM | POA: Diagnosis not present

## 2023-12-10 DIAGNOSIS — N8110 Cystocele, unspecified: Secondary | ICD-10-CM | POA: Diagnosis not present

## 2023-12-10 DIAGNOSIS — Z23 Encounter for immunization: Secondary | ICD-10-CM | POA: Diagnosis not present

## 2023-12-10 DIAGNOSIS — N952 Postmenopausal atrophic vaginitis: Secondary | ICD-10-CM | POA: Diagnosis not present

## 2023-12-10 DIAGNOSIS — E538 Deficiency of other specified B group vitamins: Secondary | ICD-10-CM | POA: Diagnosis not present

## 2023-12-15 DIAGNOSIS — N3946 Mixed incontinence: Secondary | ICD-10-CM | POA: Diagnosis not present

## 2023-12-15 DIAGNOSIS — Z79899 Other long term (current) drug therapy: Secondary | ICD-10-CM | POA: Diagnosis not present

## 2023-12-15 DIAGNOSIS — N3642 Intrinsic sphincter deficiency (ISD): Secondary | ICD-10-CM | POA: Diagnosis not present

## 2024-01-05 DIAGNOSIS — I10 Essential (primary) hypertension: Secondary | ICD-10-CM | POA: Diagnosis not present

## 2024-01-05 DIAGNOSIS — Z1212 Encounter for screening for malignant neoplasm of rectum: Secondary | ICD-10-CM | POA: Diagnosis not present

## 2024-01-05 DIAGNOSIS — E538 Deficiency of other specified B group vitamins: Secondary | ICD-10-CM | POA: Diagnosis not present

## 2024-01-05 DIAGNOSIS — D649 Anemia, unspecified: Secondary | ICD-10-CM | POA: Diagnosis not present

## 2024-01-05 DIAGNOSIS — Z0189 Encounter for other specified special examinations: Secondary | ICD-10-CM | POA: Diagnosis not present

## 2024-01-05 DIAGNOSIS — E559 Vitamin D deficiency, unspecified: Secondary | ICD-10-CM | POA: Diagnosis not present

## 2024-01-07 DIAGNOSIS — L814 Other melanin hyperpigmentation: Secondary | ICD-10-CM | POA: Diagnosis not present

## 2024-01-07 DIAGNOSIS — L565 Disseminated superficial actinic porokeratosis (DSAP): Secondary | ICD-10-CM | POA: Diagnosis not present

## 2024-01-07 DIAGNOSIS — L820 Inflamed seborrheic keratosis: Secondary | ICD-10-CM | POA: Diagnosis not present

## 2024-01-07 DIAGNOSIS — L570 Actinic keratosis: Secondary | ICD-10-CM | POA: Diagnosis not present

## 2024-01-07 DIAGNOSIS — D1801 Hemangioma of skin and subcutaneous tissue: Secondary | ICD-10-CM | POA: Diagnosis not present

## 2024-01-07 DIAGNOSIS — Z85828 Personal history of other malignant neoplasm of skin: Secondary | ICD-10-CM | POA: Diagnosis not present

## 2024-01-07 DIAGNOSIS — L821 Other seborrheic keratosis: Secondary | ICD-10-CM | POA: Diagnosis not present

## 2024-01-12 DIAGNOSIS — E538 Deficiency of other specified B group vitamins: Secondary | ICD-10-CM | POA: Diagnosis not present

## 2024-01-12 DIAGNOSIS — I10 Essential (primary) hypertension: Secondary | ICD-10-CM | POA: Diagnosis not present

## 2024-01-12 DIAGNOSIS — M1990 Unspecified osteoarthritis, unspecified site: Secondary | ICD-10-CM | POA: Diagnosis not present

## 2024-01-12 DIAGNOSIS — H547 Unspecified visual loss: Secondary | ICD-10-CM | POA: Diagnosis not present

## 2024-01-12 DIAGNOSIS — Z9884 Bariatric surgery status: Secondary | ICD-10-CM | POA: Diagnosis not present

## 2024-01-12 DIAGNOSIS — Z1331 Encounter for screening for depression: Secondary | ICD-10-CM | POA: Diagnosis not present

## 2024-01-12 DIAGNOSIS — Z1339 Encounter for screening examination for other mental health and behavioral disorders: Secondary | ICD-10-CM | POA: Diagnosis not present

## 2024-01-12 DIAGNOSIS — M810 Age-related osteoporosis without current pathological fracture: Secondary | ICD-10-CM | POA: Diagnosis not present

## 2024-01-12 DIAGNOSIS — Z0000 Encounter for general adult medical examination without abnormal findings: Secondary | ICD-10-CM | POA: Diagnosis not present

## 2024-01-12 DIAGNOSIS — Z792 Long term (current) use of antibiotics: Secondary | ICD-10-CM | POA: Diagnosis not present

## 2024-01-12 DIAGNOSIS — N393 Stress incontinence (female) (male): Secondary | ICD-10-CM | POA: Diagnosis not present

## 2024-01-12 DIAGNOSIS — F4389 Other reactions to severe stress: Secondary | ICD-10-CM | POA: Diagnosis not present

## 2024-01-13 ENCOUNTER — Other Ambulatory Visit (HOSPITAL_COMMUNITY): Payer: Self-pay | Admitting: Internal Medicine

## 2024-01-13 DIAGNOSIS — M810 Age-related osteoporosis without current pathological fracture: Secondary | ICD-10-CM | POA: Insufficient documentation

## 2024-01-14 ENCOUNTER — Telehealth (HOSPITAL_COMMUNITY): Payer: Self-pay

## 2024-01-14 NOTE — Telephone Encounter (Signed)
 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

## 2024-01-15 ENCOUNTER — Encounter (HOSPITAL_COMMUNITY): Payer: Self-pay | Admitting: Internal Medicine

## 2024-01-18 ENCOUNTER — Encounter (HOSPITAL_COMMUNITY): Payer: Self-pay | Admitting: Internal Medicine

## 2024-01-28 DIAGNOSIS — N3642 Intrinsic sphincter deficiency (ISD): Secondary | ICD-10-CM | POA: Diagnosis not present

## 2024-01-28 DIAGNOSIS — N3946 Mixed incontinence: Secondary | ICD-10-CM | POA: Diagnosis not present

## 2024-01-29 ENCOUNTER — Ambulatory Visit (HOSPITAL_COMMUNITY)
Admission: RE | Admit: 2024-01-29 | Discharge: 2024-01-29 | Disposition: A | Discharge status: Home / Self Care | Payer: Self-pay | Source: Ambulatory Visit | Attending: Internal Medicine | Admitting: Internal Medicine

## 2024-01-29 VITALS — BP 151/94 | HR 66 | Temp 98.1°F | Resp 17

## 2024-01-29 DIAGNOSIS — M810 Age-related osteoporosis without current pathological fracture: Secondary | ICD-10-CM | POA: Diagnosis not present

## 2024-01-29 DIAGNOSIS — M8580 Other specified disorders of bone density and structure, unspecified site: Secondary | ICD-10-CM | POA: Insufficient documentation

## 2024-01-29 MED ORDER — SODIUM CHLORIDE 0.9 % IV SOLN
INTRAVENOUS | Status: DC
Start: 2024-01-29 — End: 2024-01-29

## 2024-01-29 MED ORDER — DIPHENHYDRAMINE HCL 25 MG PO CAPS
25.0000 mg | ORAL_CAPSULE | Freq: Once | ORAL | Status: DC
Start: 2024-01-29 — End: 2024-01-29

## 2024-01-29 MED ORDER — ACETAMINOPHEN 325 MG PO TABS
ORAL_TABLET | ORAL | Status: AC
  Filled 2024-01-29: qty 2

## 2024-01-29 MED ORDER — ZOLEDRONIC ACID 5 MG/100ML IV SOLN
5.0000 mg | Freq: Once | INTRAVENOUS | Status: AC
Start: 2024-01-29 — End: 2024-01-29
  Administered 2024-01-29: 5 mg via INTRAVENOUS

## 2024-01-29 MED ORDER — ZOLEDRONIC ACID 5 MG/100ML IV SOLN
INTRAVENOUS | Status: AC
  Filled 2024-01-29: qty 100

## 2024-01-29 MED ORDER — ACETAMINOPHEN 325 MG PO TABS
650.0000 mg | ORAL_TABLET | Freq: Once | ORAL | Status: AC
  Administered 2024-01-29: 650 mg via ORAL
